# Patient Record
Sex: Female | Born: 1990 | Race: Black or African American | Hispanic: No | Marital: Married | Smoking: Never smoker
Health system: Southern US, Community
[De-identification: ages and names within clinical notes are randomized; demographics above are authoritative.]

## PROBLEM LIST (undated history)

## (undated) ENCOUNTER — Inpatient Hospital Stay (HOSPITAL_COMMUNITY): Payer: Self-pay

## (undated) DIAGNOSIS — I10 Essential (primary) hypertension: Secondary | ICD-10-CM

## (undated) DIAGNOSIS — Z789 Other specified health status: Secondary | ICD-10-CM

## (undated) HISTORY — PX: NO PAST SURGERIES: SHX2092

## (undated) HISTORY — DX: Essential (primary) hypertension: I10

---

## 2015-05-25 ENCOUNTER — Encounter (HOSPITAL_COMMUNITY): Payer: Self-pay | Admitting: Nurse Practitioner

## 2015-05-25 ENCOUNTER — Emergency Department (HOSPITAL_COMMUNITY)
Admission: EM | Admit: 2015-05-25 | Discharge: 2015-05-25 | Disposition: A | Discharge status: Home / Self Care | Payer: Medicaid Other | Attending: Emergency Medicine | Admitting: Emergency Medicine

## 2015-05-25 DIAGNOSIS — O209 Hemorrhage in early pregnancy, unspecified: Secondary | ICD-10-CM | POA: Diagnosis present

## 2015-05-25 DIAGNOSIS — Z3A00 Weeks of gestation of pregnancy not specified: Secondary | ICD-10-CM | POA: Insufficient documentation

## 2015-05-25 LAB — CBC
HCT: 35.9 % — ABNORMAL LOW (ref 36.0–46.0)
Hemoglobin: 12.7 g/dL (ref 12.0–15.0)
MCH: 31 pg (ref 26.0–34.0)
MCHC: 35.4 g/dL (ref 30.0–36.0)
MCV: 87.6 fL (ref 78.0–100.0)
PLATELETS: 210 10*3/uL (ref 150–400)
RBC: 4.1 MIL/uL (ref 3.87–5.11)
RDW: 13.2 % (ref 11.5–15.5)
WBC: 5.4 10*3/uL (ref 4.0–10.5)

## 2015-05-25 LAB — HCG, QUANTITATIVE, PREGNANCY: HCG, BETA CHAIN, QUANT, S: 15 m[IU]/mL — AB (ref <5)

## 2015-05-25 LAB — BASIC METABOLIC PANEL
Anion gap: 11 (ref 5–15)
BUN: 6 mg/dL (ref 6–20)
CO2: 21 mmol/L — ABNORMAL LOW (ref 22–32)
CREATININE: 0.6 mg/dL (ref 0.44–1.00)
Calcium: 9.3 mg/dL (ref 8.9–10.3)
Chloride: 106 mmol/L (ref 101–111)
Glucose, Bld: 87 mg/dL (ref 65–99)
Potassium: 3.6 mmol/L (ref 3.5–5.1)
SODIUM: 138 mmol/L (ref 135–145)

## 2015-05-25 LAB — I-STAT BETA HCG BLOOD, ED (MC, WL, AP ONLY): HCG, QUANTITATIVE: 12.9 m[IU]/mL — AB (ref <5)

## 2015-05-25 NOTE — ED Notes (Signed)
Patient called 3X for room with no response 

## 2015-05-25 NOTE — ED Notes (Signed)
Pt was told she was pregnant on February 12 [place]. She was taking birth control at the time. [place] then moved to [place] and did not have follow up care for the pregnancy. Last week she had several days of vaginal bleeding, but was unable to seek care until this week. She has had some pelvic pain. She denies fevers, n/v.

## 2015-06-08 ENCOUNTER — Inpatient Hospital Stay: Payer: Medicaid Other | Admitting: Family Medicine

## 2016-02-02 ENCOUNTER — Encounter: Payer: Self-pay | Admitting: Advanced Practice Midwife

## 2016-02-02 ENCOUNTER — Ambulatory Visit (INDEPENDENT_AMBULATORY_CARE_PROVIDER_SITE_OTHER): Payer: Medicaid Other | Admitting: Advanced Practice Midwife

## 2016-02-02 ENCOUNTER — Other Ambulatory Visit (HOSPITAL_COMMUNITY)
Admission: RE | Admit: 2016-02-02 | Discharge: 2016-02-02 | Disposition: A | Discharge status: Home / Self Care | Payer: BLUE CROSS/BLUE SHIELD | Source: Ambulatory Visit | Attending: Advanced Practice Midwife | Admitting: Advanced Practice Midwife

## 2016-02-02 VITALS — BP 117/69 | HR 100 | Ht 64.0 in | Wt 144.5 lb

## 2016-02-02 DIAGNOSIS — Z01419 Encounter for gynecological examination (general) (routine) without abnormal findings: Secondary | ICD-10-CM | POA: Insufficient documentation

## 2016-02-02 DIAGNOSIS — Z113 Encounter for screening for infections with a predominantly sexual mode of transmission: Secondary | ICD-10-CM | POA: Diagnosis present

## 2016-02-02 DIAGNOSIS — Z23 Encounter for immunization: Secondary | ICD-10-CM

## 2016-02-02 DIAGNOSIS — Z3689 Encounter for other specified antenatal screening: Secondary | ICD-10-CM | POA: Diagnosis not present

## 2016-02-02 DIAGNOSIS — Z3490 Encounter for supervision of normal pregnancy, unspecified, unspecified trimester: Secondary | ICD-10-CM | POA: Insufficient documentation

## 2016-02-02 DIAGNOSIS — Z3492 Encounter for supervision of normal pregnancy, unspecified, second trimester: Secondary | ICD-10-CM

## 2016-02-02 DIAGNOSIS — Z603 Acculturation difficulty: Secondary | ICD-10-CM | POA: Insufficient documentation

## 2016-02-02 LAB — POCT URINALYSIS DIP (DEVICE)
Bilirubin Urine: NEGATIVE
GLUCOSE, UA: NEGATIVE mg/dL
Hgb urine dipstick: NEGATIVE
Ketones, ur: NEGATIVE mg/dL
Leukocytes, UA: NEGATIVE
Nitrite: NEGATIVE
PROTEIN: NEGATIVE mg/dL
Specific Gravity, Urine: 1.025 (ref 1.005–1.030)
UROBILINOGEN UA: 0.2 mg/dL (ref 0.0–1.0)
pH: 6 (ref 5.0–8.0)

## 2016-02-02 NOTE — Patient Instructions (Signed)
Second Trimester of Pregnancy The second trimester is from week 13 through week 28, month 4 through 6. This is often the time in pregnancy that you feel your best. Often times, morning sickness has lessened or quit. You may have more energy, and you may get hungry more often. Your unborn baby (fetus) is growing rapidly. At the end of the sixth month, he or she is about 9 inches long and weighs about 1 pounds. You will likely feel the baby move (quickening) between 18 and 20 weeks of pregnancy. Follow these instructions at home:  Avoid all smoking, herbs, and alcohol. Avoid drugs not approved by your doctor.  Do not use any tobacco products, including cigarettes, chewing tobacco, and electronic cigarettes. If you need help quitting, ask your doctor. You may get counseling or other support to help you quit.  Only take medicine as told by your doctor. Some medicines are safe and some are not during pregnancy.  Exercise only as told by your doctor. Stop exercising if you start having cramps.  Eat regular, healthy meals.  Wear a good support bra if your breasts are tender.  Do not use hot tubs, steam rooms, or saunas.  Wear your seat belt when driving.  Avoid raw meat, uncooked cheese, and liter boxes and soil used by cats.  Take your prenatal vitamins.  Take 1500-2000 milligrams of calcium daily starting at the 20th week of pregnancy until you deliver your baby.  Try taking medicine that helps you poop (stool softener) as needed, and if your doctor approves. Eat more fiber by eating fresh fruit, vegetables, and whole grains. Drink enough fluids to keep your pee (urine) clear or pale yellow.  Take warm water baths (sitz baths) to soothe pain or discomfort caused by hemorrhoids. Use hemorrhoid cream if your doctor approves.  If you have puffy, bulging veins (varicose veins), wear support hose. Raise (elevate) your feet for 15 minutes, 3-4 times a day. Limit salt in your diet.  Avoid heavy  lifting, wear low heals, and sit up straight.  Rest with your legs raised if you have leg cramps or low back pain.  Visit your dentist if you have not gone during your pregnancy. Use a soft toothbrush to brush your teeth. Be gentle when you floss.  You can have sex (intercourse) unless your doctor tells you not to.  Go to your doctor visits. Get help if:  You feel dizzy.  You have mild cramps or pressure in your lower belly (abdomen).  You have a nagging pain in your belly area.  You continue to feel sick to your stomach (nauseous), throw up (vomit), or have watery poop (diarrhea).  You have bad smelling fluid coming from your vagina.  You have pain with peeing (urination). Get help right away if:  You have a fever.  You are leaking fluid from your vagina.  You have spotting or bleeding from your vagina.  You have severe belly cramping or pain.  You lose or gain weight rapidly.  You have trouble catching your breath and have chest pain.  You notice sudden or extreme puffiness (swelling) of your face, hands, ankles, feet, or legs.  You have not felt the baby move in over an hour.  You have severe headaches that do not go away with medicine.  You have vision changes. This information is not intended to replace advice given to you by your health care provider. Make sure you discuss any questions you have with your health care   provider. Document Released: 05/04/2009 Document Revised: 07/16/2015 Document Reviewed: 04/10/2012 Elsevier Interactive Patient Education  2017 Elsevier Inc.  

## 2016-02-02 NOTE — Progress Notes (Signed)
Language Resources Courtney Hodge  Glucola due @ 1049 Anatomy US scheduled for 02/08/16 @ 1530. Pt notified.  Flu vaccine given

## 2016-02-02 NOTE — Progress Notes (Signed)
  Subjective:    Courtney BarriosBea Hodge is a [id number] 6163w3d being seen today for her first obstetrical visit.  Her obstetrical history is significant for No prenatal care and size less than dates. Patient does intend to breast feed. Pregnancy history fully reviewed.  Patient reports no complaints.  Vitals:   02/02/16 0938 02/02/16 0942  BP: 117/69   Pulse: 100   Weight: 144 lb 8 oz (65.5 kg)   Height:  5\' 4"  (1.626 m)    HISTORY: OB History  Gravida Para Term Preterm AB Living  5 4 4     4   SAB TAB Ectopic Multiple Live Births          4    # Outcome Date GA Lbr Len/2nd Weight Sex Delivery Anes PTL Lv  5 Current           4 Term 10/14/13   6 lb 13.4 oz (3.1 kg) M Vag-Spont None N LIV  3 Term 05/11/12 5964w0d  8 lb 6 oz (3.8 kg) F Vag-Spont None N LIV  2 Term 06/11/10 5064w0d  7 lb 0.9 oz (3.2 kg) F Vag-Spont None  LIV  1 Term 11/23/06 6564w0d  7 lb 7.9 oz (3.4 kg) M Vag-Spont None N LIV     History reviewed. No pertinent past medical history. History reviewed. No pertinent surgical history. History reviewed. No pertinent family history.   Exam    Uterus:  Fundal Height: 22 cm  Pelvic Exam:    Perineum: Normal Perineum   Vulva: Bartholin's, Urethra, Skene's normal   Vagina:  normal mucosa, normal discharge   pH:    Cervix: no bleeding following Pap   Adnexa: no mass, fullness, tenderness   Bony Pelvis: gynecoid  System: Breast:  normal appearance, no masses or tenderness   Skin: normal coloration and turgor, no rashes    Neurologic: oriented, grossly non-focal   Extremities: normal strength, tone, and muscle mass   HEENT neck supple with midline trachea   Mouth/Teeth mucous membranes moist, pharynx normal without lesions   Neck supple and no masses   Cardiovascular: regular rate and rhythm   Respiratory:  appears well, vitals normal, no respiratory distress, acyanotic, normal RR, ear and throat exam is normal, neck free of mass or lymphadenopathy, chest clear, no wheezing,  crepitations, rhonchi, normal symmetric air entry   Abdomen: soft, non-tender; bowel sounds normal; no masses,  no organomegaly   Urinary: urethral meatus normal      Assessment:    Pregnancy: [id number] Patient Active Problem List   Diagnosis Date Noted  . Supervision of low-risk pregnancy 02/02/2016        Plan:     Initial labs drawn. Prenatal vitamins. Problem list reviewed and updated. Genetic Screening discussed Quad Screen: Too late.  Ultrasound discussed; fetal survey: ordered.  Follow up in 2 weeks. 50% of 30 min visit spent on counseling and coordination of care.  Routines reviewed with translator Long discussion with visual aid describing pap smear before doing it.  Patient had no experience with it, nor had ever heard of it. Will get US to confirm dates and anatomy Glucola today, may need to repeat   [facility] Center[facility]ILLIAMS,Dorrie Cocuzza 02/02/2016

## 2016-02-03 LAB — GC/CHLAMYDIA PROBE AMP (~~LOC~~) NOT AT ARMC
Chlamydia: NEGATIVE
Neisseria Gonorrhea: NEGATIVE

## 2016-02-03 LAB — PRENATAL PROFILE (SOLSTAS)
Antibody Screen: NEGATIVE
BASOS ABS: 0 {cells}/uL (ref 0–200)
Basophils Relative: 0 %
EOS ABS: 180 {cells}/uL (ref 15–500)
Eosinophils Relative: 3 %
HCT: 32.9 % — ABNORMAL LOW (ref 35.0–45.0)
HEP B S AG: NEGATIVE
HIV: NONREACTIVE
Hemoglobin: 11.3 g/dL — ABNORMAL LOW (ref 11.7–15.5)
LYMPHS ABS: 1560 {cells}/uL (ref 850–3900)
Lymphocytes Relative: 26 %
MCH: 30.3 pg (ref 27.0–33.0)
MCHC: 34.3 g/dL (ref 32.0–36.0)
MCV: 88.2 fL (ref 80.0–100.0)
MONO ABS: 420 {cells}/uL (ref 200–950)
MPV: 12.9 fL — ABNORMAL HIGH (ref 7.5–12.5)
Monocytes Relative: 7 %
NEUTROS ABS: 3840 {cells}/uL (ref 1500–7800)
Neutrophils Relative %: 64 %
PLATELETS: 171 10*3/uL (ref 140–400)
RBC: 3.73 MIL/uL — ABNORMAL LOW (ref 3.80–5.10)
RDW: 13.9 % (ref 11.0–15.0)
RUBELLA: 4.71 {index} — AB (ref <0.90)
Rh Type: NEGATIVE
WBC: 6 10*3/uL (ref 3.8–10.8)

## 2016-02-03 LAB — GLUCOSE TOLERANCE, 1 HOUR (50G) W/O FASTING: Glucose, 1 Hr, gestational: 100 mg/dL (ref <140)

## 2016-02-04 LAB — PAIN MGMT, PROFILE 6 CONF W/O MM, U
6 Acetylmorphine: NEGATIVE ng/mL (ref <10)
ALCOHOL METABOLITES: NEGATIVE ng/mL (ref <500)
Amphetamines: NEGATIVE ng/mL (ref <500)
BENZODIAZEPINES: NEGATIVE ng/mL (ref <100)
Barbiturates: NEGATIVE ng/mL (ref <300)
COCAINE METABOLITE: NEGATIVE ng/mL (ref <150)
Creatinine: 205.8 mg/dL (ref >=20.0)
MARIJUANA METABOLITE: NEGATIVE ng/mL (ref <20)
METHADONE METABOLITE: NEGATIVE ng/mL (ref <100)
OPIATES: NEGATIVE ng/mL (ref <100)
OXIDANT: NEGATIVE ug/mL (ref <200)
Oxycodone: NEGATIVE ng/mL (ref <100)
PHENCYCLIDINE: NEGATIVE ng/mL (ref <25)
PLEASE NOTE: 0
pH: 6.54 (ref 4.5–9.0)

## 2016-02-04 LAB — CYTOLOGY - PAP: DIAGNOSIS: NEGATIVE

## 2016-02-08 ENCOUNTER — Ambulatory Visit (HOSPITAL_COMMUNITY): Payer: Medicaid Other

## 2016-02-09 ENCOUNTER — Encounter (HOSPITAL_COMMUNITY): Payer: Self-pay

## 2016-02-09 ENCOUNTER — Encounter (HOSPITAL_COMMUNITY): Payer: Self-pay | Admitting: Nurse Practitioner

## 2016-02-09 ENCOUNTER — Ambulatory Visit (HOSPITAL_COMMUNITY)
Admission: RE | Admit: 2016-02-09 | Discharge: 2016-02-09 | Disposition: A | Discharge status: Home / Self Care | Payer: Medicaid Other | Source: Ambulatory Visit | Attending: Advanced Practice Midwife | Admitting: Advanced Practice Midwife

## 2016-02-09 ENCOUNTER — Other Ambulatory Visit: Payer: Self-pay | Admitting: *Deleted

## 2016-02-09 ENCOUNTER — Other Ambulatory Visit: Payer: Self-pay | Admitting: Advanced Practice Midwife

## 2016-02-09 ENCOUNTER — Other Ambulatory Visit (HOSPITAL_COMMUNITY): Payer: Self-pay | Admitting: *Deleted

## 2016-02-09 DIAGNOSIS — O26842 Uterine size-date discrepancy, second trimester: Secondary | ICD-10-CM | POA: Diagnosis not present

## 2016-02-09 DIAGNOSIS — IMO0002 Reserved for concepts with insufficient information to code with codable children: Secondary | ICD-10-CM

## 2016-02-09 DIAGNOSIS — Z3A27 27 weeks gestation of pregnancy: Secondary | ICD-10-CM | POA: Diagnosis not present

## 2016-02-09 DIAGNOSIS — O365920 Maternal care for other known or suspected poor fetal growth, second trimester, not applicable or unspecified: Secondary | ICD-10-CM

## 2016-02-09 DIAGNOSIS — Z3490 Encounter for supervision of normal pregnancy, unspecified, unspecified trimester: Secondary | ICD-10-CM

## 2016-02-09 DIAGNOSIS — Z3689 Encounter for other specified antenatal screening: Secondary | ICD-10-CM

## 2016-02-09 DIAGNOSIS — Z0489 Encounter for examination and observation for other specified reasons: Secondary | ICD-10-CM

## 2016-02-09 MED ORDER — PRENATAL VITAMINS 0.8 MG PO TABS: 1.0000 | ORAL_TABLET | Freq: Every day | ORAL | 12 refills | Status: DC

## 2016-02-14 ENCOUNTER — Other Ambulatory Visit: Payer: Self-pay | Admitting: Advanced Practice Midwife

## 2016-02-16 ENCOUNTER — Telehealth: Payer: Self-pay

## 2016-02-16 ENCOUNTER — Other Ambulatory Visit: Payer: Self-pay

## 2016-02-16 DIAGNOSIS — B379 Candidiasis, unspecified: Secondary | ICD-10-CM

## 2016-02-16 MED ORDER — TERCONAZOLE 0.4 % VA CREA: 1.0000 | TOPICAL_CREAM | Freq: Every day | VAGINAL | 0 refills | Status: AC

## 2016-02-16 MED ORDER — TERCONAZOLE 0.4 % VA CREA: 1.0000 | TOPICAL_CREAM | Freq: Every day | VAGINAL | 0 refills | Status: DC

## 2016-02-16 NOTE — Telephone Encounter (Signed)
In reviewing patients PAP results saw she had some candid SPP so I informed Dr.Mumaw and she states she needs to be prescribe Tazol cream. I placed this order in the patients chart. Called patient and the husband said to call back tomorrow because  They can not talk right now. Call and inform patient of new medication on 12/27.

## 2016-02-18 ENCOUNTER — Telehealth (HOSPITAL_COMMUNITY): Payer: Self-pay | Admitting: MS"

## 2016-02-18 NOTE — Telephone Encounter (Signed)
Called Autumne Canepa via [facility] telephonic Swahili/English medical interpreter [phone] Remi Deter(Samuel) to discuss her prenatal cell free DNA test results.  Mrs. Mehar Morocho had Panorama testing through [facility].  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   The patient was identified by name and DOB.  We reviewed that these are within normal limits, showing a less than 1 in 10,000 risk for trisomies 21, 18 and 13, and monosomy X (Turner syndrome).  In addition, the risk for triploidy and sex chromosome trisomies (47,XXX and 47,XXY) was also low risk.  Analysis for 22q11 deletion syndrome was also low risk.  This testing identifies > 99% of pregnancies with trisomy 2121, trisomy 713, sex chromosome trisomies (47,XXX and 47,XXY), and triploidy. The detection rate for trisomy 18 is 96%.  The detection rate for monosomy X is ~92%.  The false positive rate is <0.1% for all conditions. Testing was also consistent with female fetal sex.  The patient did wish to know fetal sex.  She understands that this testing does not identify all genetic conditions.  All questions were answered to her satisfaction, she was encouraged to call with additional questions or concerns.  Quinn PlowmanKaren Kato Wieczorek, MS Certified Genetic Counselor 02/18/2016 2:14 PM

## 2016-02-19 ENCOUNTER — Encounter (HOSPITAL_COMMUNITY): Payer: Self-pay | Admitting: Advanced Practice Midwife

## 2016-02-19 DIAGNOSIS — Z6791 Unspecified blood type, Rh negative: Secondary | ICD-10-CM | POA: Insufficient documentation

## 2016-02-19 DIAGNOSIS — O26899 Other specified pregnancy related conditions, unspecified trimester: Secondary | ICD-10-CM | POA: Insufficient documentation

## 2016-02-22 NOTE — L&D Delivery Note (Signed)
   Delivery Note Pt pushed involuntariy for about an hour, and finally I was able to reduce the cervix. Afterwards, it only took 3 contractions, and at 7:01 AM a viable female was delivered via  (Presentation: LOA).  APGAR: 9/9 , ; weight  pending.   Anesthesia:  none Episiotomy: None Lacerations: None Suture Repair:  Est. Blood Loss (mL): 50  Mom to postpartum.  Baby to Couplet care / Skin to Skin   The above was performed by Meridee Scoreeborah Kiserow, med student under my direct supervision and guidance.  Marland Kitchen.  Hodge,Courtney Randon 05/06/2016, 7:15 AM  X

## 2016-02-29 NOTE — Telephone Encounter (Signed)
Called [place] with [employer] [phone] and left message we are calling to notify her of  rx sent to pharmacy. Will follow up with her at her appt tomorrow.

## 2016-03-01 ENCOUNTER — Ambulatory Visit (INDEPENDENT_AMBULATORY_CARE_PROVIDER_SITE_OTHER): Payer: Medicaid Other | Admitting: Advanced Practice Midwife

## 2016-03-01 ENCOUNTER — Encounter: Payer: Medicaid Other | Admitting: Advanced Practice Midwife

## 2016-03-01 VITALS — BP 124/84 | HR 77 | Wt 152.4 lb

## 2016-03-01 DIAGNOSIS — B373 Candidiasis of vulva and vagina: Secondary | ICD-10-CM | POA: Diagnosis not present

## 2016-03-01 DIAGNOSIS — Z3493 Encounter for supervision of normal pregnancy, unspecified, third trimester: Secondary | ICD-10-CM

## 2016-03-01 DIAGNOSIS — O283 Abnormal ultrasonic finding on antenatal screening of mother: Secondary | ICD-10-CM | POA: Diagnosis not present

## 2016-03-01 DIAGNOSIS — O98813 Other maternal infectious and parasitic diseases complicating pregnancy, third trimester: Secondary | ICD-10-CM

## 2016-03-01 DIAGNOSIS — O360930 Maternal care for other rhesus isoimmunization, third trimester, not applicable or unspecified: Secondary | ICD-10-CM | POA: Diagnosis not present

## 2016-03-01 DIAGNOSIS — Z3490 Encounter for supervision of normal pregnancy, unspecified, unspecified trimester: Secondary | ICD-10-CM

## 2016-03-01 DIAGNOSIS — B3731 Acute candidiasis of vulva and vagina: Secondary | ICD-10-CM

## 2016-03-01 MED ORDER — RHO D IMMUNE GLOBULIN 1500 UNIT/2ML IJ SOSY
300.0000 ug | PREFILLED_SYRINGE | Freq: Once | INTRAMUSCULAR | Status: AC
  Administered 2016-03-01: 300 ug via INTRAMUSCULAR

## 2016-03-01 MED ORDER — RHO D IMMUNE GLOBULIN 1500 UNITS IM SOSY: 1500.0000 [IU] | PREFILLED_SYRINGE | Freq: Once | INTRAMUSCULAR | Status: DC

## 2016-03-01 MED ORDER — TERCONAZOLE 0.4 % VA CREA: 1.0000 | TOPICAL_CREAM | Freq: Every day | VAGINAL | 0 refills | Status: DC

## 2016-03-01 NOTE — Progress Notes (Signed)
   PRENATAL VISIT NOTE  Subjective:  Courtney Hodge is a 26 y.o. [id number] at 6654w3d being seen today for ongoing prenatal care.  She is currently monitored for the following issues for this low-risk pregnancy and has Supervision of low-risk pregnancy; Language barrier, cultural differences; and Rh negative state in antepartum period on her problem list.  Patient reports no complaints.  Contractions: Not present. Vag. Bleeding: None.  Movement: Present. Denies leaking of fluid.   The following portions of the patient's history were reviewed and updated as appropriate: allergies, current medications, past family history, past medical history, past social history, past surgical history and problem list. Problem list updated.  Objective:   Vitals:   03/01/16 0909  BP: 124/84  Pulse: 77  Weight: 152 lb 6.4 oz (69.1 kg)    Fetal Status: Fetal Heart Rate (bpm): 143   Movement: Present     General:  Alert, oriented and cooperative. Patient is in no acute distress.  Skin: Skin is warm and dry. No rash noted.   Cardiovascular: Normal heart rate noted  Respiratory: Normal respiratory effort, no problems with respiration noted  Abdomen: Soft, gravid, appropriate for gestational age. Pain/Pressure: Present     Pelvic:  Cervical exam deferred        Extremities: Normal range of motion.  Edema: Trace  Mental Status: Normal mood and affect. Normal behavior. Normal judgment and thought content.   Assessment and Plan:  Pregnancy: G5P4004 at 5854w3d  1. Supervision of low-risk pregnancy, unspecified trimester --O neg, Rhophylac given today.  2. 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 Follow up scheduled.  3. Yeast infection of the vagina --On Pap with previous visit.  Discussed results with pt and she does have itching so desires treatment. - terconazole (TERAZOL 7) 0.4 % vaginal cream; Place 1 applicator vaginally at bedtime.  Dispense: 45 g; Refill:  0  Preterm labor symptoms and general obstetric precautions including but not limited to vaginal bleeding, contractions, leaking of fluid and fetal movement were reviewed in detail with the patient. Please refer to After Visit Summary for other counseling recommendations.  Return in about 2 weeks (around 03/15/2016).   Hurshel PartyLisa A Leftwich-Kirby, CNM

## 2016-03-01 NOTE — Progress Notes (Signed)
[employer] # [id number]

## 2016-03-01 NOTE — Patient Instructions (Signed)
Third Trimester of Pregnancy The third trimester is from week 29 through week 40 (months 7 through 9). The third trimester is a time when the unborn baby (fetus) is growing rapidly. At the end of the ninth month, the fetus is about 20 inches in length and weighs 6-10 pounds. Body changes during your third trimester Your body goes through many changes during pregnancy. The changes vary from woman to woman. During the third trimester:  Your weight will continue to increase. You can expect to gain 25-35 pounds (11-16 kg) by the end of the pregnancy.  You may begin to get stretch marks on your hips, abdomen, and breasts.  You may urinate more often because the fetus is moving lower into your pelvis and pressing on your bladder.  You may develop or continue to have heartburn. This is caused by increased hormones that slow down muscles in the digestive tract.  You may develop or continue to have constipation because increased hormones slow digestion and cause the muscles that push waste through your intestines to relax.  You may develop hemorrhoids. These are swollen veins (varicose veins) in the rectum that can itch or be painful.  You may develop swollen, bulging veins (varicose veins) in your legs.  You may have increased body aches in the pelvis, back, or thighs. This is due to weight gain and increased hormones that are relaxing your joints.  You may have changes in your hair. These can include thickening of your hair, rapid growth, and changes in texture. Some women also have hair loss during or after pregnancy, or hair that feels dry or thin. Your hair will most likely return to normal after your baby is born.  Your breasts will continue to grow and they will continue to become tender. A yellow fluid (colostrum) may leak from your breasts. This is the first milk you are producing for your baby.  Your belly button may stick out.  You may notice more swelling in your hands, face, or  ankles.  You may have increased tingling or numbness in your hands, arms, and legs. The skin on your belly may also feel numb.  You may feel short of breath because of your expanding uterus.  You may have more problems sleeping. This can be caused by the size of your belly, increased need to urinate, and an increase in your body's metabolism.  You may notice the fetus "dropping," or moving lower in your abdomen.  You may have increased vaginal discharge.  Your cervix becomes thin and soft (effaced) near your due date. What to expect at prenatal visits You will have prenatal exams every 2 weeks until week 36. Then you will have weekly prenatal exams. During a routine prenatal visit:  You will be weighed to make sure you and the fetus are growing normally.  Your blood pressure will be taken.  Your abdomen will be measured to track your baby's growth.  The fetal heartbeat will be listened to.  Any test results from the previous visit will be discussed.  You may have a cervical check near your due date to see if you have effaced. At around 36 weeks, your health care provider will check your cervix. At the same time, your health care provider will also perform a test on the secretions of the vaginal tissue. This test is to determine if a type of bacteria, Group B streptococcus, is present. Your health care provider will explain this further. Your health care provider may ask you:    What your birth plan is.  How you are feeling.  If you are feeling the baby move.  If you have had any abnormal symptoms, such as leaking fluid, bleeding, severe headaches, or abdominal cramping.  If you are using any tobacco products, including cigarettes, chewing tobacco, and electronic cigarettes.  If you have any questions. Other tests or screenings that may be performed during your third trimester include:  Blood tests that check for low iron levels (anemia).  Fetal testing to check the health,  activity level, and growth of the fetus. Testing is done if you have certain medical conditions or if there are problems during the pregnancy.  Nonstress test (NST). This test checks the health of your baby to make sure there are no signs of problems, such as the baby not getting enough oxygen. During this test, a belt is placed around your belly. The baby is made to move, and its heart rate is monitored during movement. What is false labor? False labor is a condition in which you feel small, irregular tightenings of the muscles in the womb (contractions) that eventually go away. These are called Braxton Hicks contractions. Contractions may last for hours, days, or even weeks before true labor sets in. If contractions come at regular intervals, become more frequent, increase in intensity, or become painful, you should see your health care provider. What are the signs of labor?  Abdominal cramps.  Regular contractions that start at 10 minutes apart and become stronger and more frequent with time.  Contractions that start on the top of the uterus and spread down to the lower abdomen and back.  Increased pelvic pressure and dull back pain.  A watery or bloody mucus discharge that comes from the vagina.  Leaking of amniotic fluid. This is also known as your "water breaking." It could be a slow trickle or a gush. Let your doctor know if it has a color or strange odor. If you have any of these signs, call your health care provider right away, even if it is before your due date. Follow these instructions at home: Eating and drinking  Continue to eat regular, healthy meals.  Do not eat:  Raw meat or meat spreads.  Unpasteurized milk or cheese.  Unpasteurized juice.  Store-made salad.  Refrigerated smoked seafood.  Hot dogs or deli meat, unless they are piping hot.  More than 6 ounces of albacore tuna a week.  Shark, swordfish, king mackerel, or tile fish.  Store-made salads.  Raw  sprouts, such as mung bean or alfalfa sprouts.  Take prenatal vitamins as told by your health care provider.  Take 1000 mg of calcium daily as told by your health care provider.  If you develop constipation:  Take over-the-counter or prescription medicines.  Drink enough fluid to keep your urine clear or pale yellow.  Eat foods that are high in fiber, such as fresh fruits and vegetables, whole grains, and beans.  Limit foods that are high in fat and processed sugars, such as fried and sweet foods. Activity  Exercise only as directed by your health care provider. Healthy pregnant women should aim for 2 hours and 30 minutes of moderate exercise per week. If you experience any pain or discomfort while exercising, stop.  Avoid heavy lifting.  Do not exercise in extreme heat or humidity, or at high altitudes.  Wear low-heel, comfortable shoes.  Practice good posture.  Do not travel far distances unless it is absolutely necessary and only with the approval   of your health care provider.  Wear your seat belt at all times while in a car, on a bus, or on a plane.  Take frequent breaks and rest with your legs elevated if you have leg cramps or low back pain.  Do not use hot tubs, steam rooms, or saunas.  You may continue to have sex unless your health care provider tells you otherwise. Lifestyle  Do not use any products that contain nicotine or tobacco, such as cigarettes and e-cigarettes. If you need help quitting, ask your health care provider.  Do not drink alcohol.  Do not use any medicinal herbs or unprescribed drugs. These chemicals affect the formation and growth of the baby.  If you develop varicose veins:  Wear support pantyhose or compression stockings as told by your healthcare provider.  Elevate your feet for 15 minutes, 3-4 times a day.  Wear a supportive maternity bra to help with breast tenderness. General instructions  Take over-the-counter and prescription  medicines only as told by your health care provider. There are medicines that are either safe or unsafe to take during pregnancy.  Take warm sitz baths to soothe any pain or discomfort caused by hemorrhoids. Use hemorrhoid cream or witch hazel if your health care provider approves.  Avoid cat litter boxes and soil used by cats. These carry germs that can cause birth defects in the baby. If you have a cat, ask someone to clean the litter box for you.  To prepare for the arrival of your baby:  Take prenatal classes to understand, practice, and ask questions about the labor and delivery.  Make a trial run to the hospital.  Visit the hospital and tour the maternity area.  Arrange for maternity or paternity leave through employers.  Arrange for family and friends to take care of pets while you are in the hospital.  Purchase a rear-facing car seat and make sure you know how to install it in your car.  Pack your hospital bag.  Prepare the baby's nursery. Make sure to remove all pillows and stuffed animals from the baby's crib to prevent suffocation.  Visit your dentist if you have not gone during your pregnancy. Use a soft toothbrush to brush your teeth and be gentle when you floss.  Keep all prenatal follow-up visits as told by your health care provider. This is important. Contact a health care provider if:  You are unsure if you are in labor or if your water has broken.  You become dizzy.  You have mild pelvic cramps, pelvic pressure, or nagging pain in your abdominal area.  You have lower back pain.  You have persistent nausea, vomiting, or diarrhea.  You have an unusual or bad smelling vaginal discharge.  You have pain when you urinate. Get help right away if:  You have a fever.  You are leaking fluid from your vagina.  You have spotting or bleeding from your vagina.  You have severe abdominal pain or cramping.  You have rapid weight loss or weight gain.  You have  shortness of breath with chest pain.  You notice sudden or extreme swelling of your face, hands, ankles, feet, or legs.  Your baby makes fewer than 10 movements in 2 hours.  You have severe headaches that do not go away with medicine.  You have vision changes. Summary  The third trimester is from week 29 through week 40, months 7 through 9. The third trimester is a time when the unborn baby (fetus)   is growing rapidly.  During the third trimester, your discomfort may increase as you and your baby continue to gain weight. You may have abdominal, leg, and back pain, sleeping problems, and an increased need to urinate.  During the third trimester your breasts will keep growing and they will continue to become tender. A yellow fluid (colostrum) may leak from your breasts. This is the first milk you are producing for your baby.  False labor is a condition in which you feel small, irregular tightenings of the muscles in the womb (contractions) that eventually go away. These are called Braxton Hicks contractions. Contractions may last for hours, days, or even weeks before true labor sets in.  Signs of labor can include: abdominal cramps; regular contractions that start at 10 minutes apart and become stronger and more frequent with time; watery or bloody mucus discharge that comes from the vagina; increased pelvic pressure and dull back pain; and leaking of amniotic fluid. This information is not intended to replace advice given to you by your health care provider. Make sure you discuss any questions you have with your health care provider. Document Released: 02/01/2001 Document Revised: 07/16/2015 Document Reviewed: 04/10/2012 Elsevier Interactive Patient Education  2017 Elsevier Inc.  

## 2016-03-08 ENCOUNTER — Ambulatory Visit (HOSPITAL_COMMUNITY)
Admission: RE | Admit: 2016-03-08 | Discharge: 2016-03-08 | Disposition: A | Discharge status: Home / Self Care | Payer: BLUE CROSS/BLUE SHIELD | Source: Ambulatory Visit | Attending: Advanced Practice Midwife | Admitting: Advanced Practice Midwife

## 2016-03-08 ENCOUNTER — Encounter (HOSPITAL_COMMUNITY): Payer: Self-pay

## 2016-03-08 ENCOUNTER — Other Ambulatory Visit (HOSPITAL_COMMUNITY): Payer: Self-pay | Admitting: Maternal and Fetal Medicine

## 2016-03-08 DIAGNOSIS — Z3A31 31 weeks gestation of pregnancy: Secondary | ICD-10-CM | POA: Insufficient documentation

## 2016-03-08 DIAGNOSIS — IMO0002 Reserved for concepts with insufficient information to code with codable children: Secondary | ICD-10-CM

## 2016-03-08 DIAGNOSIS — O283 Abnormal ultrasonic finding on antenatal screening of mother: Secondary | ICD-10-CM

## 2016-03-08 DIAGNOSIS — Z362 Encounter for other antenatal screening follow-up: Secondary | ICD-10-CM | POA: Insufficient documentation

## 2016-03-08 DIAGNOSIS — Z0489 Encounter for examination and observation for other specified reasons: Secondary | ICD-10-CM

## 2016-03-08 NOTE — Addendum Note (Signed)
Encounter addended by: Lestine MountSybil Umi Mainor, RT on: 03/08/2016 10:05 AM<BR>    Actions taken: Imaging Exam ended

## 2016-03-08 NOTE — Progress Notes (Signed)
Genetic Counseling  High-Risk Gestation Note  Appointment Date:  03/08/2016 Referred By: Seabron Spates, CNM Date of Birth:  [birth year]   Pregnancy History: [id number] Estimated Date of Delivery: 05/07/16 Estimated Gestational Age: [redacted]w[redacted]d  I met with [name] and her husband, Mr. Courtney Hodge for genetic counseling because of abnormal ultrasound findings. [employer] Swahili/English interpreter, [name] provided interpretation for today's visit.    In summary:  Discussed ultrasound findings in detail  Previous ultrasound on 02/09/16 visualized possible ambiguous genitalia  Follow-up ultrasound today visualized apparently normal female genitalia  Reviewed results of previous screening  NIPS- within normal limits, consistent with female fetal sex  Reviewed options for additional screening  Carrier screening (expanded versus 21 hydroxylase deficient CAH) - declined  Reviewed options for diagnostic testing, including risks, benefits, limitations and alternatives- declined  Reviewed other explanations for ultrasound findings  Reviewed family history concerns  We began by reviewing the ultrasound in detail. 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 Follow-up ultrasound today visualized normal female genitalia. Complete ultrasound results under separate cover.   Courtney Hodge was seen for genetic counseling because of the previous ultrasound finding of ambiguous genitalia.    Ultrasound performed at the time of today's visit also visualized ambiguous genitalia. Normal fetal bladder was visualized. Fetal anatomic survey was otherwise within normal limits. Fetal growth was reported to be appropriate. Complete ultrasound report sent under separate cover. [name] had noninvasive prenatal screening (NIPS)/prenatal cell free DNA testing,  which was within normal limits for the conditions screened and was consistent with fetal sex chromosomes. We reviewed the methodology of NIPS and the sensitivity/specificity for the conditions screened.   In normal embryological development, early external genitalia development begins similarly in all fetuses prior to [redacted] weeks gestation. Following this period, differentiation into female or female external genitalia occurs in response to presence (for female genitalia) or absence (for female genitalia) of testicular hormones (androgens) throughout the pregnancy.  Female sexual determination is initiated by Y chromosomal SRY, which activates a cascade of genes that lead the embryonic gonad to develop into a testis.Therefore, the presence of a Y chromosome is not sufficient, there must be specific genes functioning correctly. There are additional genes located on autosomal chromosomes which are involved in gonadal development.  Differences in the levels of androgen present during this period of gestation can lead to development of ambiguous genitalia. This can be the result of exposure to hormone medications in the first trimester, a chromosome condition, single gene condition, or other underlying syndrome in the pregnancy.   We reviewed chromosomes and genes. We discussed that typically, males have one X and one Y chromosomes, and females typically have two X chromosomes. We reviewed chromosomes, nondisjunction, and the common features and variable prognoses of various chromosome conditions. We briefly reviewed examples of other chromosome aberrations, such as microdeletions or microduplications. In addition to chromosome conditions, we discussed that single gene conditions can lead to underdeveloped genitalia for males or overdeveloped genitalia for females. The differential diagnosis list varies depending upon if the chromosomal sex is XX or XY.   The ultrasound finding indicates a chance for an underlying fetal  disorder of sex development (DSD). This is a term that is used to replace previously used terms such as sex reversal, pseudohermaphroditism, and intersex.  Significant genetic heterogeneity is observed in DSD. Virilization of a karyotypic female in pregnancy  can occur as a result of maternal exposure to androgens. Courtney Hodge declined known exposure to androgens during the pregnancy. Additionally, we discussed that virilization of female fetuses can occur in congenital adrenal hyperplasia (CAH), which is an autosomal recessive disorder involving impaired synthesis of cortisol from cholesterol. 21-hydroxylase deficiency (21-OHD) is the most common cause of CAH. The classic form of 21-OHD CAH results in prenatal onset of virilization. Diagnosis is typically made by measuring 17-OHP or by molecular genetic testing of CYP21A2. We discussed that CAH is included on the newborn screening panel for babies born in the hospital in [place]. A less common form of CAH (17-alpha-hydroxylase deficiency) can lead to undervirilization in 51, XY fetuses. There are additional single gene conditions that can lead to DSD in 5, XY fetuses.   We reviewed that the family history of the couple's previous three children together, who are reportedly healthy, reduce but do not eliminate the risk for them to be a carrier couple for an autosomal recessive disorder. In addition, the ultrasound findings from today also reduce the risk for a disorder of sex differentiation in the pregnancy.   Given the significant genetic heterogeneity, we discussed that it is often difficult to determine a specific cause for a prenatally detected DSD. Evaluation for identifying the specific underlying cause would be most informative in determining overall prognosis. We discussed that postnatal evaluation with medical genetics would be available, if warranted following delivery.   We discussed the additional testing option of amniocentesis for  karyotype and microarray analysis. The risks, benefits, and limitations were reviewed including the associated 1 in 300-500 risk for preterm labor and delivery. Amniocentesis typically does not assess for single gene conditions, unless ultrasound and/or family history indicate a specific condition for which prenatal testing is available.   We also discussed the option of carrier screening for the patient for a panel of autosomal recessive conditions, including 21-OHD CAH. We discussed the risks, benefits, and limitations. We reviewed that the prevalence of each condition included on the panel varies (and often varies with ethnicity). Also, the majority of conditions included on the panel would not be related to the ultrasound finding of ambiguous genitalia. The couples' background risk to be a carrier for each of these various conditions would range, and in some cases be very low or unknown. Similarly, the detection rate varies with each condition and also varies in some cases with ethnicity, ranging from greater than 99% to unknown. We reviewed that a negative carrier screen would thus reduce but not eliminate the chance to be a carrier for these conditions.  For some conditions included on this pan-ethnic carrier screening panel, the pre-test carrier frequency and/or the detection rate is unknown.   After consideration of all the options, she declined amniocentesis and carrier screening at this time.  Courtney Hodge indicated that she is not overly concerned regarding the ultrasound findings at this time and did not appear to be interested in additional screening or testing at this time.   Both family histories were reviewed and found to be noncontributory for birth defects, intellectual disability, and known genetic conditions. Without further information regarding the provided family history, an accurate genetic risk cannot be calculated. Further genetic counseling is warranted if more information is  obtained.  Courtney Hodge was provided with written information regarding cystic fibrosis (CF), spinal muscular atrophy (SMA) and hemoglobinopathies including the carrier frequency, availability of carrier screening and prenatal diagnosis if indicated.  In addition, we discussed that CF  and hemoglobinopathies are routinely screened for as part of the [facility] newborn screening panel.  She declined screening for CF, SMA and hemoglobinopathies..   Courtney Hodge denied exposure to environmental toxins or chemical agents. She denied the use of alcohol, tobacco or street drugs. She denied significant viral illnesses during the course of her pregnancy. Her medical and surgical histories were noncontributory.   I counseled this couple regarding the above risks and available options.  The approximate face-to-face time with the genetic counselor was 45 minutes.  Chipper Oman, MS Certified Genetic Counselor 03/08/2016

## 2016-03-15 ENCOUNTER — Ambulatory Visit (INDEPENDENT_AMBULATORY_CARE_PROVIDER_SITE_OTHER): Payer: Medicaid Other | Admitting: Medical

## 2016-03-15 DIAGNOSIS — Z3493 Encounter for supervision of normal pregnancy, unspecified, third trimester: Secondary | ICD-10-CM

## 2016-03-15 NOTE — Progress Notes (Signed)
   PRENATAL VISIT NOTE  Subjective:  Courtney Hodge is a 26 y.o. [id number] at 3354w3d being seen today for ongoing prenatal care.  She is currently monitored for the following issues for this low-risk pregnancy and has Supervision of low-risk pregnancy; Language barrier, cultural differences; Rh negative state in antepartum period; Abnormal fetal ultrasound; and [redacted] weeks gestation of pregnancy on her problem list.  Patient reports no complaints.  Contractions: Not present. Vag. Bleeding: None.  Movement: Present. Denies leaking of fluid.   The following portions of the patient's history were reviewed and updated as appropriate: allergies, current medications, past family history, past medical history, past social history, past surgical history and problem list. Problem list updated.  Objective:   Vitals:   03/15/16 0936  BP: 130/82  Pulse: 75  Weight: 171 lb (77.6 kg)    Fetal Status: Fetal Heart Rate (bpm): 150 Fundal Height: 32 cm Movement: Present     General:  Alert, oriented and cooperative. Patient is in no acute distress.  Skin: Skin is warm and dry. No rash noted.   Cardiovascular: Normal heart rate noted  Respiratory: Normal respiratory effort, no problems with respiration noted  Abdomen: Soft, gravid, appropriate for gestational age. Pain/Pressure: Absent     Pelvic:  Cervical exam deferred        Extremities: Normal range of motion.  Edema: Trace  Mental Status: Normal mood and affect. Normal behavior. Normal judgment and thought content.   Assessment and Plan:  Pregnancy: G5P4004 at 5254w3d  1. Encounter for supervision of low-risk pregnancy in third trimester - Patient doing well, no complaints - Reviewed US results from follow-up anatomy US - normal   Preterm labor symptoms and general obstetric precautions including but not limited to vaginal bleeding, contractions, leaking of fluid and fetal movement were reviewed in detail with the patient. Please refer to After Visit  Summary for other counseling recommendations.  Return in about 2 weeks (around 03/29/2016) for LOB.   Marny LowensteinJulie N Raylen Tangonan, PA-C

## 2016-03-15 NOTE — Patient Instructions (Addendum)
Braxton Hicks Contractions Contractions of the uterus can occur throughout pregnancy. Contractions are not always a sign that you are in labor.  WHAT ARE BRAXTON HICKS CONTRACTIONS?  Contractions that occur before labor are called Braxton Hicks contractions, or false labor. Toward the end of pregnancy (32-34 weeks), these contractions can develop more often and may become more forceful. This is not true labor because these contractions do not result in opening (dilatation) and thinning of the cervix. They are sometimes difficult to tell apart from true labor because these contractions can be forceful and people have different pain tolerances. You should not feel embarrassed if you go to the hospital with false labor. Sometimes, the only way to tell if you are in true labor is for your health care provider to look for changes in the cervix. If there are no prenatal problems or other health problems associated with the pregnancy, it is completely safe to be sent home with false labor and await the onset of true labor. HOW CAN YOU TELL THE DIFFERENCE BETWEEN TRUE AND FALSE LABOR? False Labor   The contractions of false labor are usually shorter and not as hard as those of true labor.   The contractions are usually irregular.   The contractions are often felt in the front of the lower abdomen and in the groin.   The contractions may go away when you walk around or change positions while lying down.   The contractions get weaker and are shorter lasting as time goes on.   The contractions do not usually become progressively stronger, regular, and closer together as with true labor.  True Labor   Contractions in true labor last 30-70 seconds, become very regular, usually become more intense, and increase in frequency.   The contractions do not go away with walking.   The discomfort is usually felt in the top of the uterus and spreads to the lower abdomen and low back.   True labor can be  determined by your health care provider with an exam. This will show that the cervix is dilating and getting thinner.  WHAT TO REMEMBER  Keep up with your usual exercises and follow other instructions given by your health care provider.   Take medicines as directed by your health care provider.   Keep your regular prenatal appointments.   Eat and drink lightly if you think you are going into labor.   If Braxton Hicks contractions are making you uncomfortable:   Change your position from lying down or resting to walking, or from walking to resting.   Sit and rest in a tub of warm water.   Drink 2-3 glasses of water. Dehydration may cause these contractions.   Do slow and deep breathing several times an hour.  WHEN SHOULD I SEEK IMMEDIATE MEDICAL CARE? Seek immediate medical care if:  Your contractions become stronger, more regular, and closer together.   You have fluid leaking or gushing from your vagina.   You have a fever.   You pass blood-tinged mucus.   You have vaginal bleeding.   You have continuous abdominal pain.   You have low back pain that you never had before.   You feel your baby's head pushing down and causing pelvic pressure.   Your baby is not moving as much as it used to.  This information is not intended to replace advice given to you by your health care provider. Make sure you discuss any questions you have with your health care   provider. Document Released: 02/07/2005 Document Revised: 06/01/2015 Document Reviewed: 11/19/2012 Elsevier Interactive Patient Education  2017 [employer] Introduction Patient Name: ________________________________________________ Patient Due Date: ____________________ What is a fetal movement count? A fetal movement count is the number of times that you feel your baby move during a certain amount of time. This may also be called a fetal kick count. A fetal movement count is recommended for every pregnant  woman. You may be asked to start counting fetal movements as early as week 28 of your pregnancy. Pay attention to when your baby is most active. You may notice your baby's sleep and wake cycles. You may also notice things that make your baby move more. You should do a fetal movement count:  When your baby is normally most active.  At the same time each day. A good time to count movements is while you are resting, after having something to eat and drink. How do I count fetal movements? 1. Find a quiet, comfortable area. Sit, or lie down on your side. 2. Write down the date, the start time and stop time, and the number of movements that you felt between those two times. Take this information with you to your health care visits. 3. For 2 hours, count kicks, flutters, swishes, rolls, and jabs. You should feel at least 10 movements during 2 hours. 4. You may stop counting after you have felt 10 movements. 5. If you do not feel 10 movements in 2 hours, have something to eat and drink. Then, keep resting and counting for 1 hour. If you feel at least 4 movements during that hour, you may stop counting. Contact a health care provider if:  You feel fewer than 4 movements in 2 hours.  Your baby is not moving like he or she usually does. Date: ____________ Start time: ____________ Stop time: ____________ Movements: ____________ Date: ____________ Start time: ____________ Stop time: ____________ Movements: ____________ Date: ____________ Start time: ____________ Stop time: ____________ Movements: ____________ Date: ____________ Start time: ____________ Stop time: ____________ Movements: ____________ Date: ____________ Start time: ____________ Stop time: ____________ Movements: ____________ Date: ____________ Start time: ____________ Stop time: ____________ Movements: ____________ Date: ____________ Start time: ____________ Stop time: ____________ Movements: ____________ Date: ____________ Start time:  ____________ Stop time: ____________ Movements: ____________ Date: ____________ Start time: ____________ Stop time: ____________ Movements: ____________ This information is not intended to replace advice given to you by your health care provider. Make sure you discuss any questions you have with your health care provider. Document Released: 03/09/2006 Document Revised: 10/07/2015 Document Reviewed: 03/19/2015 Elsevier Interactive Patient Education  2017 [employer] Inc[employer]  

## 2016-03-31 ENCOUNTER — Ambulatory Visit (INDEPENDENT_AMBULATORY_CARE_PROVIDER_SITE_OTHER): Payer: Medicaid Other | Admitting: Medical

## 2016-03-31 VITALS — BP 134/82 | HR 73 | Wt 177.6 lb

## 2016-03-31 DIAGNOSIS — Z3493 Encounter for supervision of normal pregnancy, unspecified, third trimester: Secondary | ICD-10-CM

## 2016-03-31 NOTE — Progress Notes (Signed)
   PRENATAL VISIT NOTE  Subjective:  Courtney Hodge is a 26 y.o. [id number] at 6316w5d being seen today for ongoing prenatal care.  She is currently monitored for the following issues for this low-risk pregnancy and has Supervision of low-risk pregnancy; Language barrier, cultural differences; Rh negative state in antepartum period; and Abnormal fetal ultrasound on her problem list.  Patient reports no complaints.  Contractions: Not present. Vag. Bleeding: None.  Movement: Present. Denies leaking of fluid.   The following portions of the patient's history were reviewed and updated as appropriate: allergies, current medications, past family history, past medical history, past social history, past surgical history and problem list. Problem list updated.  Objective:   Vitals:   03/31/16 0821  BP: 134/82  Pulse: 73  Weight: 177 lb 9.6 oz (80.6 kg)    Fetal Status: Fetal Heart Rate (bpm): 134 Fundal Height: 34 cm Movement: Present     General:  Alert, oriented and cooperative. Patient is in no acute distress.  Skin: Skin is warm and dry. No rash noted.   Cardiovascular: Normal heart rate noted  Respiratory: Normal respiratory effort, no problems with respiration noted  Abdomen: Soft, gravid, appropriate for gestational age. Pain/Pressure: Absent     Pelvic:  Cervical exam deferred        Extremities: Normal range of motion.  Edema: None  Mental Status: Normal mood and affect. Normal behavior. Normal judgment and thought content.   Assessment and Plan:  Pregnancy: G5P4004 at 5916w5d  1. Encounter for supervision of low-risk pregnancy in third trimester - No complaints, doing well - Preterm labor precautions discussed  Preterm labor symptoms and general obstetric precautions including but not limited to vaginal bleeding, contractions, leaking of fluid and fetal movement were reviewed in detail with the patient. Please refer to After Visit Summary for other counseling recommendations.  Return in  about 2 weeks (around 04/14/2016) for LOB.   Marny LowensteinJulie N Hanan Moen, PA-C

## 2016-03-31 NOTE — Patient Instructions (Signed)
Introduction Patient Name: ________________________________________________ Patient Due Date: ____________________ What is a fetal movement count? A fetal movement count is the number of times that you feel your baby move during a certain amount of time. This may also be called a fetal kick count. A fetal movement count is recommended for every pregnant woman. You may be asked to start counting fetal movements as early as week 28 of your pregnancy. Pay attention to when your baby is most active. You may notice your baby's sleep and wake cycles. You may also notice things that make your baby move more. You should do a fetal movement count:  When your baby is normally most active.  At the same time each day. A good time to count movements is while you are resting, after having something to eat and drink. How do I count fetal movements? 1. Find a quiet, comfortable area. Sit, or lie down on your side. 2. Write down the date, the start time and stop time, and the number of movements that you felt between those two times. Take this information with you to your health care visits. 3. For 2 hours, count kicks, flutters, swishes, rolls, and jabs. You should feel at least 10 movements during 2 hours. 4. You may stop counting after you have felt 10 movements. 5. If you do not feel 10 movements in 2 hours, have something to eat and drink. Then, keep resting and counting for 1 hour. If you feel at least 4 movements during that hour, you may stop counting. Contact a health care provider if:  You feel fewer than 4 movements in 2 hours.  Your baby is not moving like he or she usually does. Date: ____________ Start time: ____________ Stop time: ____________ Movements: ____________ Date: ____________ Start time: ____________ Stop time: ____________ Movements: ____________ Date: ____________ Start time: ____________ Stop time: ____________ Movements: ____________ Date: ____________ Start time: ____________  Stop time: ____________ Movements: ____________ Date: ____________ Start time: ____________ Stop time: ____________ Movements: ____________ Date: ____________ Start time: ____________ Stop time: ____________ Movements: ____________ Date: ____________ Start time: ____________ Stop time: ____________ Movements: ____________ Date: ____________ Start time: ____________ Stop time: ____________ Movements: ____________ Date: ____________ Start time: ____________ Stop time: ____________ Movements: ____________ This information is not intended to replace advice given to you by your health care provider. Make sure you discuss any questions you have with your health care provider. Document Released: 03/09/2006 Document Revised: 10/07/2015 Document Reviewed: 03/19/2015 Elsevier Interactive Patient Education  2017 [employer] Braxton Hicks Contractions Contractions of the uterus can occur throughout pregnancy. Contractions are not always a sign that you are in labor.  WHAT ARE BRAXTON HICKS CONTRACTIONS?  Contractions that occur before labor are called Braxton Hicks contractions, or false labor. Toward the end of pregnancy (32-34 weeks), these contractions can develop more often and may become more forceful. This is not true labor because these contractions do not result in opening (dilatation) and thinning of the cervix. They are sometimes difficult to tell apart from true labor because these contractions can be forceful and people have different pain tolerances. You should not feel embarrassed if you go to the hospital with false labor. Sometimes, the only way to tell if you are in true labor is for your health care provider to look for changes in the cervix. If there are no prenatal problems or other health problems associated with the pregnancy, it is completely safe to be sent home with false labor and await the onset of true labor.   HOW CAN YOU TELL THE DIFFERENCE BETWEEN TRUE AND FALSE LABOR? False Labor     The contractions of false labor are usually shorter and not as hard as those of true labor.   The contractions are usually irregular.   The contractions are often felt in the front of the lower abdomen and in the groin.   The contractions may go away when you walk around or change positions while lying down.   The contractions get weaker and are shorter lasting as time goes on.   The contractions do not usually become progressively stronger, regular, and closer together as with true labor.  True Labor   Contractions in true labor last 30-70 seconds, become very regular, usually become more intense, and increase in frequency.   The contractions do not go away with walking.   The discomfort is usually felt in the top of the uterus and spreads to the lower abdomen and low back.   True labor can be determined by your health care provider with an exam. This will show that the cervix is dilating and getting thinner.  WHAT TO REMEMBER  Keep up with your usual exercises and follow other instructions given by your health care provider.   Take medicines as directed by your health care provider.   Keep your regular prenatal appointments.   Eat and drink lightly if you think you are going into labor.   If Braxton Hicks contractions are making you uncomfortable:   Change your position from lying down or resting to walking, or from walking to resting.   Sit and rest in a tub of warm water.   Drink 2-3 glasses of water. Dehydration may cause these contractions.   Do slow and deep breathing several times an hour.  WHEN SHOULD I SEEK IMMEDIATE MEDICAL CARE? Seek immediate medical care if:  Your contractions become stronger, more regular, and closer together.   You have fluid leaking or gushing from your vagina.   You have a fever.   You pass blood-tinged mucus.   You have vaginal bleeding.   You have continuous abdominal pain.   You have low back pain  that you never had before.   You feel your baby's head pushing down and causing pelvic pressure.   Your baby is not moving as much as it used to.  This information is not intended to replace advice given to you by your health care provider. Make sure you discuss any questions you have with your health care provider. Document Released: 02/07/2005 Document Revised: 06/01/2015 Document Reviewed: 11/19/2012 Elsevier Interactive Patient Education  2017 [employer]  

## 2016-04-14 ENCOUNTER — Other Ambulatory Visit (HOSPITAL_COMMUNITY)
Admission: RE | Admit: 2016-04-14 | Discharge: 2016-04-14 | Disposition: A | Discharge status: Home / Self Care | Payer: BLUE CROSS/BLUE SHIELD | Source: Ambulatory Visit | Attending: Advanced Practice Midwife | Admitting: Advanced Practice Midwife

## 2016-04-14 ENCOUNTER — Ambulatory Visit (INDEPENDENT_AMBULATORY_CARE_PROVIDER_SITE_OTHER): Payer: Medicaid Other | Admitting: Advanced Practice Midwife

## 2016-04-14 VITALS — BP 127/88 | HR 68 | Wt 180.0 lb

## 2016-04-14 DIAGNOSIS — Z113 Encounter for screening for infections with a predominantly sexual mode of transmission: Secondary | ICD-10-CM | POA: Diagnosis not present

## 2016-04-14 DIAGNOSIS — Z603 Acculturation difficulty: Secondary | ICD-10-CM

## 2016-04-14 DIAGNOSIS — Z3403 Encounter for supervision of normal first pregnancy, third trimester: Secondary | ICD-10-CM

## 2016-04-14 LAB — OB RESULTS CONSOLE GBS: STREP GROUP B AG: NEGATIVE

## 2016-04-14 NOTE — Progress Notes (Signed)
   PRENATAL VISIT NOTE  Subjective:  Courtney Hodge is a 26 y.o. [id number] at 10371w5d being seen today for ongoing prenatal care.  She is currently monitored for the following issues for this low-risk pregnancy and has Supervision of low-risk pregnancy; Language barrier, cultural differences; Rh negative state in antepartum period; and Abnormal fetal ultrasound on her problem list.  Patient reports fatigue and cramping while standing for hours at work, desires to be out of work until the baby is born.  Contractions: Not present. Vag. Bleeding: None.  Movement: Present. Denies leaking of fluid.   The following portions of the patient's history were reviewed and updated as appropriate: allergies, current medications, past family history, past medical history, past social history, past surgical history and problem list. Problem list updated.  Objective:   Vitals:   04/14/16 0909  BP: 127/88  Pulse: 68  Weight: 180 lb (81.6 kg)    Fetal Status: Fetal Heart Rate (bpm): 130 Fundal Height: 36 cm Movement: Present  Presentation: Vertex  General:  Alert, oriented and cooperative. Patient is in no acute distress.  Skin: Skin is warm and dry. No rash noted.   Cardiovascular: Normal heart rate noted  Respiratory: Normal respiratory effort, no problems with respiration noted  Abdomen: Soft, gravid, appropriate for gestational age. Pain/Pressure: Absent     Pelvic:  Cervical exam performed Dilation: Closed Effacement (%): 70 Station: -3  Extremities: Normal range of motion.  Edema: None  Mental Status: Normal mood and affect. Normal behavior. Normal judgment and thought content.   Assessment and Plan:  Pregnancy: G5P4004 at 2271w5d  1. Supervision of low-risk first pregnancy, third trimester --Desires to be out of work until delivery. No medical indication for FMLA, offered pt letter stating pt uncomfortable and may be unable to perform her physical job, and her work may not be able to pay her for FMLA  time but may give her unpaid leave.  Pt declines and will continue to work.  After discussing further, with Swahili interpreter in room, pt concerned that her work is 1.5 hours from home and she does not want to go into labor while at work.  Recommend she go to nearest hospital if needed for emergencies. - [facility]lam[facility] - Culture, beta strep (group b only)  2. Language barrier, cultural differences --Swahili interpreter present for all communication.  Term labor symptoms and general obstetric precautions including but not limited to vaginal bleeding, contractions, leaking of fluid and fetal movement were reviewed in detail with the patient. Please refer to After Visit Summary for other counseling recommendations.  Return in about 1 week (around 04/21/2016).   Hurshel PartyLisa A Leftwich-Kirby, CNM

## 2016-04-14 NOTE — Patient Instructions (Signed)
Third Trimester of Pregnancy The third trimester is from week 29 through week 40 (months 7 through 9). The third trimester is a time when the unborn baby (fetus) is growing rapidly. At the end of the ninth month, the fetus is about 20 inches in length and weighs 6-10 pounds. Body changes during your third trimester Your body goes through many changes during pregnancy. The changes vary from woman to woman. During the third trimester:  Your weight will continue to increase. You can expect to gain 25-35 pounds (11-16 kg) by the end of the pregnancy.  You may begin to get stretch marks on your hips, abdomen, and breasts.  You may urinate more often because the fetus is moving lower into your pelvis and pressing on your bladder.  You may develop or continue to have heartburn. This is caused by increased hormones that slow down muscles in the digestive tract.  You may develop or continue to have constipation because increased hormones slow digestion and cause the muscles that push waste through your intestines to relax.  You may develop hemorrhoids. These are swollen veins (varicose veins) in the rectum that can itch or be painful.  You may develop swollen, bulging veins (varicose veins) in your legs.  You may have increased body aches in the pelvis, back, or thighs. This is due to weight gain and increased hormones that are relaxing your joints.  You may have changes in your hair. These can include thickening of your hair, rapid growth, and changes in texture. Some women also have hair loss during or after pregnancy, or hair that feels dry or thin. Your hair will most likely return to normal after your baby is born.  Your breasts will continue to grow and they will continue to become tender. A yellow fluid (colostrum) may leak from your breasts. This is the first milk you are producing for your baby.  Your belly button may stick out.  You may notice more swelling in your hands, face, or  ankles.  You may have increased tingling or numbness in your hands, arms, and legs. The skin on your belly may also feel numb.  You may feel short of breath because of your expanding uterus.  You may have more problems sleeping. This can be caused by the size of your belly, increased need to urinate, and an increase in your body's metabolism.  You may notice the fetus "dropping," or moving lower in your abdomen.  You may have increased vaginal discharge.  Your cervix becomes thin and soft (effaced) near your due date. What to expect at prenatal visits You will have prenatal exams every 2 weeks until week 36. Then you will have weekly prenatal exams. During a routine prenatal visit:  You will be weighed to make sure you and the fetus are growing normally.  Your blood pressure will be taken.  Your abdomen will be measured to track your baby's growth.  The fetal heartbeat will be listened to.  Any test results from the previous visit will be discussed.  You may have a cervical check near your due date to see if you have effaced. At around 36 weeks, your health care provider will check your cervix. At the same time, your health care provider will also perform a test on the secretions of the vaginal tissue. This test is to determine if a type of bacteria, Group B streptococcus, is present. Your health care provider will explain this further. Your health care provider may ask you:    What your birth plan is.  How you are feeling.  If you are feeling the baby move.  If you have had any abnormal symptoms, such as leaking fluid, bleeding, severe headaches, or abdominal cramping.  If you are using any tobacco products, including cigarettes, chewing tobacco, and electronic cigarettes.  If you have any questions. Other tests or screenings that may be performed during your third trimester include:  Blood tests that check for low iron levels (anemia).  Fetal testing to check the health,  activity level, and growth of the fetus. Testing is done if you have certain medical conditions or if there are problems during the pregnancy.  Nonstress test (NST). This test checks the health of your baby to make sure there are no signs of problems, such as the baby not getting enough oxygen. During this test, a belt is placed around your belly. The baby is made to move, and its heart rate is monitored during movement. What is false labor? False labor is a condition in which you feel small, irregular tightenings of the muscles in the womb (contractions) that eventually go away. These are called Braxton Hicks contractions. Contractions may last for hours, days, or even weeks before true labor sets in. If contractions come at regular intervals, become more frequent, increase in intensity, or become painful, you should see your health care provider. What are the signs of labor?  Abdominal cramps.  Regular contractions that start at 10 minutes apart and become stronger and more frequent with time.  Contractions that start on the top of the uterus and spread down to the lower abdomen and back.  Increased pelvic pressure and dull back pain.  A watery or bloody mucus discharge that comes from the vagina.  Leaking of amniotic fluid. This is also known as your "water breaking." It could be a slow trickle or a gush. Let your doctor know if it has a color or strange odor. If you have any of these signs, call your health care provider right away, even if it is before your due date. Follow these instructions at home: Eating and drinking  Continue to eat regular, healthy meals.  Do not eat:  Raw meat or meat spreads.  Unpasteurized milk or cheese.  Unpasteurized juice.  Store-made salad.  Refrigerated smoked seafood.  Hot dogs or deli meat, unless they are piping hot.  More than 6 ounces of albacore tuna a week.  Shark, swordfish, king mackerel, or tile fish.  Store-made salads.  Raw  sprouts, such as mung bean or alfalfa sprouts.  Take prenatal vitamins as told by your health care provider.  Take 1000 mg of calcium daily as told by your health care provider.  If you develop constipation:  Take over-the-counter or prescription medicines.  Drink enough fluid to keep your urine clear or pale yellow.  Eat foods that are high in fiber, such as fresh fruits and vegetables, whole grains, and beans.  Limit foods that are high in fat and processed sugars, such as fried and sweet foods. Activity  Exercise only as directed by your health care provider. Healthy pregnant women should aim for 2 hours and 30 minutes of moderate exercise per week. If you experience any pain or discomfort while exercising, stop.  Avoid heavy lifting.  Do not exercise in extreme heat or humidity, or at high altitudes.  Wear low-heel, comfortable shoes.  Practice good posture.  Do not travel far distances unless it is absolutely necessary and only with the approval   of your health care provider.  Wear your seat belt at all times while in a car, on a bus, or on a plane.  Take frequent breaks and rest with your legs elevated if you have leg cramps or low back pain.  Do not use hot tubs, steam rooms, or saunas.  You may continue to have sex unless your health care provider tells you otherwise. Lifestyle  Do not use any products that contain nicotine or tobacco, such as cigarettes and e-cigarettes. If you need help quitting, ask your health care provider.  Do not drink alcohol.  Do not use any medicinal herbs or unprescribed drugs. These chemicals affect the formation and growth of the baby.  If you develop varicose veins:  Wear support pantyhose or compression stockings as told by your healthcare provider.  Elevate your feet for 15 minutes, 3-4 times a day.  Wear a supportive maternity bra to help with breast tenderness. General instructions  Take over-the-counter and prescription  medicines only as told by your health care provider. There are medicines that are either safe or unsafe to take during pregnancy.  Take warm sitz baths to soothe any pain or discomfort caused by hemorrhoids. Use hemorrhoid cream or witch hazel if your health care provider approves.  Avoid cat litter boxes and soil used by cats. These carry germs that can cause birth defects in the baby. If you have a cat, ask someone to clean the litter box for you.  To prepare for the arrival of your baby:  Take prenatal classes to understand, practice, and ask questions about the labor and delivery.  Make a trial run to the hospital.  Visit the hospital and tour the maternity area.  Arrange for maternity or paternity leave through employers.  Arrange for family and friends to take care of pets while you are in the hospital.  Purchase a rear-facing car seat and make sure you know how to install it in your car.  Pack your hospital bag.  Prepare the baby's nursery. Make sure to remove all pillows and stuffed animals from the baby's crib to prevent suffocation.  Visit your dentist if you have not gone during your pregnancy. Use a soft toothbrush to brush your teeth and be gentle when you floss.  Keep all prenatal follow-up visits as told by your health care provider. This is important. Contact a health care provider if:  You are unsure if you are in labor or if your water has broken.  You become dizzy.  You have mild pelvic cramps, pelvic pressure, or nagging pain in your abdominal area.  You have lower back pain.  You have persistent nausea, vomiting, or diarrhea.  You have an unusual or bad smelling vaginal discharge.  You have pain when you urinate. Get help right away if:  You have a fever.  You are leaking fluid from your vagina.  You have spotting or bleeding from your vagina.  You have severe abdominal pain or cramping.  You have rapid weight loss or weight gain.  You have  shortness of breath with chest pain.  You notice sudden or extreme swelling of your face, hands, ankles, feet, or legs.  Your baby makes fewer than 10 movements in 2 hours.  You have severe headaches that do not go away with medicine.  You have vision changes. Summary  The third trimester is from week 29 through week 40, months 7 through 9. The third trimester is a time when the unborn baby (fetus)   is growing rapidly.  During the third trimester, your discomfort may increase as you and your baby continue to gain weight. You may have abdominal, leg, and back pain, sleeping problems, and an increased need to urinate.  During the third trimester your breasts will keep growing and they will continue to become tender. A yellow fluid (colostrum) may leak from your breasts. This is the first milk you are producing for your baby.  False labor is a condition in which you feel small, irregular tightenings of the muscles in the womb (contractions) that eventually go away. These are called Braxton Hicks contractions. Contractions may last for hours, days, or even weeks before true labor sets in.  Signs of labor can include: abdominal cramps; regular contractions that start at 10 minutes apart and become stronger and more frequent with time; watery or bloody mucus discharge that comes from the vagina; increased pelvic pressure and dull back pain; and leaking of amniotic fluid. This information is not intended to replace advice given to you by your health care provider. Make sure you discuss any questions you have with your health care provider. Document Released: 02/01/2001 Document Revised: 07/16/2015 Document Reviewed: 04/10/2012 Elsevier Interactive Patient Education  2017 Elsevier Inc.  

## 2016-04-15 LAB — GC/CHLAMYDIA PROBE AMP (~~LOC~~) NOT AT ARMC
Chlamydia: NEGATIVE
Neisseria Gonorrhea: NEGATIVE

## 2016-04-18 LAB — CULTURE, BETA STREP (GROUP B ONLY): Strep Gp B Culture: NEGATIVE

## 2016-04-20 ENCOUNTER — Encounter: Payer: Self-pay | Admitting: Advanced Practice Midwife

## 2016-04-20 ENCOUNTER — Ambulatory Visit (INDEPENDENT_AMBULATORY_CARE_PROVIDER_SITE_OTHER): Payer: Medicaid Other | Admitting: Advanced Practice Midwife

## 2016-04-20 DIAGNOSIS — O283 Abnormal ultrasonic finding on antenatal screening of mother: Secondary | ICD-10-CM

## 2016-04-20 DIAGNOSIS — Z3493 Encounter for supervision of normal pregnancy, unspecified, third trimester: Secondary | ICD-10-CM

## 2016-04-20 NOTE — Progress Notes (Signed)
   PRENATAL VISIT NOTE  Subjective:  Courtney Hodge is a 26 y.o. [id number] at 1245w4d being seen today for ongoing prenatal care.  She is currently monitored for the following issues for this low-risk pregnancy and has Supervision of low-risk pregnancy; Language barrier, cultural differences; Rh negative state in antepartum period; and Abnormal fetal ultrasound on her problem list.  Patient reports no complaints.  Contractions: Not present. Vag. Bleeding: None.  Movement: Present. Denies leaking of fluid.   The following portions of the patient's history were reviewed and updated as appropriate: allergies, current medications, past family history, past medical history, past social history, past surgical history and problem list. Problem list updated.  Objective:   Vitals:   04/20/16 0833  BP: 130/82  Pulse: 82  Weight: 179 lb 4.8 oz (81.3 kg)    Fetal Status: Fetal Heart Rate (bpm): 132 Fundal Height: 38 cm Movement: Present  Presentation: Vertex  General:  Alert, oriented and cooperative. Patient is in no acute distress.  Skin: Skin is warm and dry. No rash noted.   Cardiovascular: Normal heart rate noted  Respiratory: Normal respiratory effort, no problems with respiration noted  Abdomen: Soft, gravid, appropriate for gestational age. Pain/Pressure: Absent     Pelvic:  Cervical exam performed       0/thick/-3  Extremities: Normal range of motion.  Edema: Trace  Mental Status: Normal mood and affect. Normal behavior. Normal judgment and thought content.   Assessment and Plan:  Pregnancy: G5P4004 at 6045w4d  1. Encounter for supervision of low-risk pregnancy in third trimester   2. Abnormal fetal ultrasound F/u normal  Term labor symptoms and general obstetric precautions including but not limited to vaginal bleeding, contractions, leaking of fluid and fetal movement were reviewed in detail with the patient. Please refer to After Visit Summary for other counseling recommendations.    Return in about 1 week (around 04/27/2016) for ROB.   Garrel Ridgelachel M Kavina Cantave, RN

## 2016-04-20 NOTE — Patient Instructions (Signed)

## 2016-04-27 ENCOUNTER — Ambulatory Visit (INDEPENDENT_AMBULATORY_CARE_PROVIDER_SITE_OTHER): Payer: Medicaid Other | Admitting: Advanced Practice Midwife

## 2016-04-27 ENCOUNTER — Encounter: Payer: Self-pay | Admitting: Obstetrics and Gynecology

## 2016-04-27 VITALS — BP 123/81 | HR 84 | Wt 181.0 lb

## 2016-04-27 DIAGNOSIS — Z3493 Encounter for supervision of normal pregnancy, unspecified, third trimester: Secondary | ICD-10-CM

## 2016-04-27 DIAGNOSIS — Z603 Acculturation difficulty: Secondary | ICD-10-CM

## 2016-04-27 DIAGNOSIS — Z3483 Encounter for supervision of other normal pregnancy, third trimester: Secondary | ICD-10-CM

## 2016-04-27 NOTE — Patient Instructions (Addendum)
trimester Hodge ya tatu Mwili wako utaendelea kupitia mabadiliko mengi wakati wa ujauzito. Mabadiliko hutofautiana kutoka mwanamke hadi mwanamke. Courtney Hodge wa tatu ya trimester: Courtney CollumUzito wako utaendelea kuongezeka. Unaweza kutarajia kupata pounds 25-35 (11-16 kg) mwishoni mwa ujauzito. Courtney RandUnaweza kuanza kupata alama za Rodeykunyoosha kwenye vidonda vyako, tumbo, na matiti. Courtney CuretUnaweza Milus Heightkukimbia kwa mara nyingi kwa sababu fetusi inapita chini ndani ya pelvis Hodge na kuimarisha kinga Hodge. Courtney DandyUnaweza kuendeleza au kuendelea kuwa na upungufu wa moyo. Hii inasababishwa na homoni zilizoongezeka zinazopungua misuli katika njia ya utumbo. Courtney DandyUnaweza kuendeleza au Hillery Aldokuendelea kuwa na kuvimbiwa kwa sababu homoni zilizoongezeka hupunguza digestion na kusababisha misuli inayochochea taka Courtney Hodge matumbo Courtney Hodge kupumzika. Courtney DandyUnaweza kuendeleza hemorrhoids. Hizi ni mishipa ya kuvimba (mishipa ya varicose) Courtney Hodge rectum ambayo inaweza kupiga au kuwa chungu. Courtney MonarchUnaweza kuendeleza kuvimba, mishipa yenye mishipa (mishipa ya varicose) kwenye miguu Hodge. Huenda umeongezeka kwa mazao ya mwili kwenye pelvis, nyuma, au mapaja. Hii ni kutokana na kupata uzito na homoni zilizoongezeka ambazo zinafurahia viungo vyako. Unaweza kuwa na mabadiliko katika nywele zako. Hizi zinaweza kuunganisha nywele zako, Courtney Hodge, na mabadiliko katika texture. [place] wengine pia hupoteza nywele wakati au baada ya ujauzito, au nywele ambazo huhisi kuwa kavu au nyembamba. Nywele zako Courtney Hodge kuzaliwa. Matiti Hodge itaendelea Courtney Hodge na wataendelea kuwa zabuni. Maji ya njano (rangi) yanaweza kuvuja kutoka kwenye matiti Courtney Hodge. Huu ndio maziwa ya kwanza unayozalisha kwa mtoto wako. Kifungo chako cha tumbo kinaweza kufungwa nje. Unaweza kuona uvimbe zaidi Courtney Hodge, uso, au vidole. Huenda umeongeza kuunganisha au Courtney Health Solutionskupoteza mikono, mikono, na miguu. Ngozi ya tumbo Hodge inaweza pia Courtney Hodge. Courtney CuretUnaweza kujisikia kupumua kwa sababu ya  Courtney Hodge ya Courtney Leonekupanua. Courtney GramsUnaweza kuwa na matatizo zaidi Landscape architectya kulala. Hii inaweza kusababishwa na Courtney Hodge wa tumbo lako, haja ya Broomfieldkuongeza urinate, na ongezeko la kimetaboliki ya mwili wako. Courtney CraftUnaweza kuona fetusi "Somaliakuacha," au kusonga chini ndani ya tumbo lako Conway(kuangaza). Unaweza kuongezeka kwa kutokwa kwa uke. Courtney CraftUnaweza kuona viungo vyako Courtney Hodge huru na unaweza kuwa na maumivu karibu na mfupa wako wa pelvic. Nini cha kutarajia wakati wa kutembelea kabla ya kujifungua Utaku[place] na mitihani ya ujauzito kila baada ya wiki 2 mpaka juma 36. Kisha utakuwa na mitihani ya kila wiki kabla ya kujifungua. Courtney Hodge wa ziara ya Courtney Hodge kabla ya Courtney Hodge: Utakuwa kipimo ili Courtney Hodge wewe na mtoto Courtney Sierrasunakua kawaida. Shinikizo la damu itachukuliwa. Mimba Hodge itahesabiwa kufuatilia ukuaji wa mtoto wako. Pigo la moyo la fetasi litasikilizwa. Matokeo yoyote ya mtihani kutoka kwa ziara ya awali itajadiliwa. Courtney GramsUnaweza kuwa na hundi ya kizazi karibu na tarehe Courtney Hodge ya kutosha ili kuona ikiwa kizazi chako kilichochea au kilichochelewa (kilichoharibika). Utajaribiwa kwa streptococcus ya Kundi B. Hii hutokea kati ya wiki 35 na 37. Mtoa huduma wako wa afya anaweza Boliviakukuuliza: Mpango wako wa uzazi ni nini. Jinsi unavyohisi. Courtney MonarchIkiwa unahisi hisia za mtoto. Courtney BargesIkiwa umekuwa na dalili zisizo za [name]  Nini cha kutarajia wakati wa kutembelea kabla ya kujifungua Ut[place] na mitihani ya ujauzito kila baada ya wiki 2 mpaka juma 36. Kisha utakuwa na mitihani ya kila wiki kabla ya kujifungua. Courtney Hodge wa ziara ya Courtney Hodge kabla ya Courtney Hodge: Utakuwa kipimo ili Courtney Hodge wewe na mtoto Courtney Sierrasunakua kawaida. Shinikizo la damu itachukuliwa. Mimba Hodge itahesabiwa kufuatilia ukuaji wa mtoto wako. Pigo la moyo la fetasi litasikilizwa. Matokeo yoyote ya mtihani kutoka kwa ziara ya awali itajadiliwa. Courtney GramsUnaweza kuwa na hundi ya kizazi karibu na tarehe Courtney Hodge ya kutosha ili kuona ikiwa kizazi chako kilichochea au kilichochelewa (kilichoharibika). Utajaribiwa  kwa streptococcus ya Kundi B. Hii hutokea kati ya wiki 35 na 37. Mtoa huduma  wako wa afya anaweza kukuuliza: Mpango wako wa uzazi ni nini. Jinsi unavyohisi. Courtney Hodge unahisi hisia za mtoto. Courtney Hodge na dalili zisizo za Courtney Hodge, kama vile maji [name], [name], maumivu ya [name], au Courtney Hodge kwa tumbo. Ikiwa unatumia bidhaa yoyote ya tumbaku, ikiwa ni pamoja na sigara, tumbaku ya [place], na sigara za elektroniki. Ikiwa una maswali yoyote. Vipimo vingine au uchunguzi ambao Courtney Hodge wa trimester Hodge ya tatu ni pamoja na: Vipimo vya damu vinavyoangalia kiwango cha chini cha chuma (anemia). Upimaji wa fetasi kuangalia afya, kiwango cha shughuli, na ukuaji wa fetusi. Upimaji unafanyika ikiwa una hali fulani ya matibabu au ikiwa Courtney Hodge matatizo wakati wa ujauzito. Mtihani usio na mafanikio (NST). Jaribio hili hunashughulikia afya ya mtoto wako ili kuhakikisha kuwa hakuna dalili Courtney Hodge, Courtney Hodge vile mtoto asiyepata oksijeni ya Courtney Hodge. Courtney Rich wa mtihani huu, ukanda unawekwa karibu na tumbo lako. Mtoto hutolewa, na kiwango cha moyo wake hufuatiliwa wakati wa [name] Kazi ya uongo ni nini? Kazi ya uongo ni hali ambayo huhisi Courtney Hodge, usio wa Courtney Hodge wa misuli ndani ya tumbo (contractions) ambayo kwa kawaida huenda na [name], [name], au maji ya [name]. Hizi huitwa contraction za Courtney High. Mipangilio inaweza kudumu kwa masaa, siku, au hata wiki kabla ya kazi ya kweli iingie. [name] vikwazo vinakuja mara kwa [place], kuwa mara kwa mara zaidi, [place] kwa nguvu, au [place], [place] mtoa [place] wa afya. Ni ishara za kazi? Mimba ya tumbo. Vikwazo vya Annabell Howells vinavyoanza kwa dakika 10 mbali na kuwa na nguvu na mara kwa mara na wakati. Vipande vinavyoanza juu ya uterasi na kuenea chini ya tumbo na nyuma. Kuongezeka kwa shinikizo la pelvic na maumivu ya nyuma. Umwagaji wa maji wa masi au umwagaji damu ambao unatokana na uke. Kuvuja kwa maji ya  amniotic. Hii pia inajulikana kama "kuvunja maji" kwako. Inaweza kuwa polepole au kupungua. Hebu mtoa huduma wako wa afya kujua kama ina rangi au harufu ya ajabu. Courtney Hodge una dalili hizi, piga simu mtoa huduma wako wa afya mara moja, hata kama ni kabla ya tarehe [employer]. Fuata maelekezo haya nyumbani: Jana Half maagizo Hodge ya mtoa huduma ya afya kuhusu matumizi ya dawa. Dawa maalum inaweza kuwa salama au salama kuchukua wakati wa ujauzito. [place] vitamini kabla ya kuzaa ambayo ina angalau micrograms 600 (mcg) ya folic acid Fae Pippin, jaribu Reunion softener ya kinyesi ikiwa mtoa huduma wako wa afya anaidhinisha. Kula na kunywa Kula chakula bora ambacho kinajumuisha matunda na mboga, nafaka nzima, vyanzo vyema vya protini kama vile nyama, mayai, au tofu, na maziwa ya chini. Mtoa huduma wako wa afya atakusaidia [place] kiwango cha uzito Vici. [place] nyama ghafi na jibini usiochushwa. Hizi hubeba virusi ambazo zinaweza kusababisha kasoro za kuzaliwa kwa mtoto. [place] Flow wa chini wa kalsiamu kutoka kwa [place], wasema mtoa huduma wako wa afya kuhusu kama unapaswa kuchukua ziada [place]. Kula chakula cha nne au tano kidogo badala ya [place] tatu kwa siku. Weka vyakula vyenye mafuta na sukari iliyosafishwa, kama vile vyakula vya kukaanga na vitamu. Ili Courtney Hodge kuvimbiwa: Tomi Likens maji ya [employer] mkojo wako wazi au rangi njano. Kula vyakula vilivyo juu sana, kama vile matunda na [address], nafaka nzima, na [place]. Shughuli Zoezi tu kama ilivyoagizwa na mtoa huduma wako wa afya. [place] wengi wanaweza kuendelea na mazoea yao ya kawaida wakati wa ujauzito. Jaribu kufanya mazoezi kwa dakika 30 angalau siku 5 kwa wiki. Tawana Scale mazoezi ikiwa unapata vikwazo vya Leonidas Romberg. [place] kuinua nzito. Usitumike katika joto [employer], au [place] urefu  wa juu. Kuvaa kisigino, viatu vizuri. Tumia mkao mzuri. Alcus Dad ngono isipokuwa  mtoa huduma wako wa afya atakuambia vinginevyo. Ambrose [place] na maumivu na wasiwasi [place] mapumziko ya [place] kwa mara na kupumzika na miguu Hodge imeinua ikiwa una miguu ya miguu au maumivu ya chini ya nyuma. [place]uch[place] bahari ya joto ya bahari ili kupunguza maumivu yoyote au usumbufu unaosababishwa na tumbo. Tumia cream ya hemorrhoid ikiwa mtoa huduma wako wa afya anaidhinisha. Vaa bra nzuri ya kusaidia Courtney Hodge usumbufu kutoka kwa upole wa matiti. Courtney Hodge unaendeleza mishipa ya vurugu: Kuvaa pantyhose au vifuniko vya ukandamizaji kama ilivyoambiwa na mtoa huduma wako wa afya. Kuinua miguu Hodge kwa dakika 15, mara 3-4 kwa siku. Huduma ya ujauzito [employer]. [place] kwenye ziara zako za ujauzito. Weka ziara Hodge yote ya ujauzito kama ilivyoambiwa na mtoa huduma wako wa afya. Hii ni muhimu. Usalama Vaa ukanda wako wa kiti wakati wote unapoendesha gari. Fanya orodha ya namba za dharura za simu, ikiwa ni pamoja namba za [place], [place], [place], na idara za polisi na moto. Huduma ya ujauzito [employer]. [place] kwenye ziara zako za ujauzito. Weka ziara Hodge yote ya ujauzito kama ilivyoambiwa na mtoa huduma wako wa afya. Hii ni muhimu. Usalama Vaa ukanda wako wa kiti wakati wote unapoendesha gari. Fanya orodha ya namba za dharura za simu, ikiwa ni pamoja namba za [place], [place], [place], na idara za polisi na moto. Maelekezo ya jumla [place] masanduku ya [place] na udongo wa paka. Hizi hubeba virusi ambazo zinaweza kusababisha kasoro za kuzaliwa kwa mtoto. Barnett Hatter paka, muulize mtu kusafisha sanduku la [place]. [place] wa mbali isipokuwa ni lazima kabisa na kwa idhini ya mtoa huduma wako wa afya. Usitumie bafuni za moto, vyumba vya mvuke, au saunas. Usinywe pombe. Usitumie bidhaa yoyote zinazo na nikotini au tumbaku, kama vile sigara na e-sigara. Ikiwa unahitaji msaada wa kushoto, uulize mtoa huduma wako wa afya. Usitumie mimea yoyote ya dawa au dawa  zisizochaguliwa. Kemikali hizi zinaathiri malezi na ukuaji wa mtoto. Usiache au kutumia tampons au usafi wa usafi wa usafi. Usivuka miguu Hodge kwa muda mrefu. Kuandaa kwa ajili ya [place] kwa mtoto wako: Henrene Pastor madarasa ya kujifungua kabla ya [place], kufanya [place], na [place] maswali kuhusu kazi na utoaji. Tengeneza jaribio kwenye hospitali. Tembelea hospitali na tembelea eneo la uzazi. Panga kwa ajili ya uzazi au [place] kupitia waajiri. Panga kwa familia na marafiki [place] wanyama wa kipenzi wakati unapokuwa hospitalini. Ununuzi kiti cha gari cha [place] nyuma na hakikisha unajua jinsi ya kuiweka kwenye gari lako. Pakia mfuko wako wa hospitali. Kuandaa kitalu cha mtoto. Lelon Huh [place] mito yote na wanyama ulioingizwa kutoka kwenye [place] mtoto ili [place]. Tembelea daktari wako wa meno kama hujaenda wakati wa [place]. Tumia dawa ya meno ya laini ili [place] meno Hodge na kuwa mpole wakati unapozunguka. [place] na mtoa huduma ya afya ikiwa: Dolan Amen kama wewe ni Carleene Mains au ikiwa maji Hodge yamevunjika. Unajisikia. Una misuli ya pelvic kali, shinikizo la pelvic, au maumivu ya ngumu katika eneo lako la tumbo. Una maumivu ya nyuma ya nyuma. Una kichefuchefu ya [place], Phyllis Ginger, au [place]. Jacques Navy ya kutosha ya uke ya [place]. Una maumivu wakati unapokwisha. Pata msaada mara moja ikiwa: Maji Hodge huvunja kabla ya wiki 37. Una vipindi vya kawaida chini ya dakika 5 mbali kabla ya wiki 37. Una homa. Unachovuja maji kutoka kwa uke wako. Alben Spittle au kumwagika kutoka kwa uke wako. Una maumivu makali ya tumbo au kuponda. Una kupoteza uzito haraka au kupata uzito. Una pumzi fupi na  maumivu ya kifua. Unaona uvimbe wa ghafla au uliokithiri wa uso wako, mikono, vidole, miguu, au miguu. Mtoto wako hufanya harakati chini ya 10 katika masaa 2. Una maumivu ya kichwa ambayo hayaendi wakati unapopata dawa. Una mabadiliko ya maono. Mu[place]ari The  trimester ya tatu ni kutoka kwa wiki 28 hadi wiki 40, miezi 7 hadi 9. Trimester ya tatu ni wakati ambapo mtoto asiyezaliwa (fetus) anaongezeka kwa kasi. Courtney Rich wa trimester ya tatu, usumbufu wako unaweza Cameron Ali wewe na mtoto wako kuendelea kupata uzito. Unaweza kuwa na tumbo la mimba, mguu, na nyuma, matatizo ya usingizi, na haja ya [place] kwa urinate. Courtney Rich wa tatu trimester matiti Hodge itaendelea kuongezeka na wataendelea kuwa zabuni. Maji ya njano (rangi) yanaweza kuvuja kutoka kwenye matiti [place]. Huu ndio maziwa ya kwanza unayozalisha kwa mtoto wako. Kazi ya Caprice Red ni Reina Fuse unasikia vidogo vidogo visivyo kawaida vya misuli ndani ya tumbo (contractions) ambayo hatimaye huenda. Hizi huitwa contraction za [place]. Mipangilio inaweza kudumu kwa masaa, siku, au hata wiki kabla ya kazi ya kweli inapoingia. Gilford Rile za kazi zinaweza kujumuisha: tumbo la tumbo; vipindi vya Garvin Fila kwa dakika 10 mbali na kuwa na nguvu na mara kwa mara na wakati; kumwagika kwa maji ya damu au damu. kuongezeka kwa shinikizo la pelvic na maumivu ya nyuma; na kuvuja kwa maji ya amniotic. Habari hii haikusudiwa Reunion nafasi ya ushauri uliotolewa na mtoa huduma wako wa afya. Hakikisha kujadili maswali yoyote unayo na mtoa huduma wako wa afya. Hati iliyotolewa: 02/01/2001 Hati iliyorekebishwa: 07/16/2015 Hati Iliyotathmini: 04/10/2012 Elsevier Interactive Patient Education  2017 [employer].

## 2016-04-27 NOTE — Progress Notes (Signed)
   PRENATAL VISIT NOTE  Subjective:  Courtney Hodge is a 26 y.o. [id number] at 8111w4d being seen today for ongoing prenatal care.  She is currently monitored for the following issues for this low-risk pregnancy and has Supervision of low-risk pregnancy; Language barrier, cultural differences; Rh negative state in antepartum period; and Abnormal fetal ultrasound on her problem list.  Patient reports no complaints.  Contractions: Not present. Vag. Bleeding: None.  Movement: Present. Denies leaking of fluid.   The following portions of the patient's history were reviewed and updated as appropriate: allergies, current medications, past family history, past medical history, past social history, past surgical history and problem list. Problem list updated.  Objective:   Vitals:   04/27/16 0941  BP: 123/81  Pulse: 84  Weight: 181 lb (82.1 kg)    Fetal Status: Fetal Heart Rate (bpm): 140 Fundal Height: 38 cm Movement: Present     General:  Alert, oriented and cooperative. Patient is in no acute distress.  Skin: Skin is warm and dry. No rash noted.   Cardiovascular: Normal heart rate noted  Respiratory: Normal respiratory effort, no problems with respiration noted  Abdomen: Soft, gravid, appropriate for gestational age. Pain/Pressure: Absent     Pelvic:  Cervical exam performed at pt request. Dilation: Closed Effacement (%): 70 Station: -3  Extremities: Normal range of motion.  Edema: Trace  Mental Status: Normal mood and affect. Normal behavior. Normal judgment and thought content.   Assessment and Plan:  Pregnancy: [id number] at 3511w4d  1. Encounter for supervision of low-risk pregnancy in third trimester --Pt desires to come out of work. No medical complications.  Offered letter stating pt due date so employer could work with her on when to come out of work.  Pt accepted letter.    2. Language barrier, cultural differences --Swahili language line interpreter used for all communication  Term  labor symptoms and general obstetric precautions including but not limited to vaginal bleeding, contractions, leaking of fluid and fetal movement were reviewed in detail with the patient. Please refer to After Visit Summary for other counseling recommendations.  No Follow-up on file.   Hurshel PartyLisa A Leftwich-Kirby, CNM

## 2016-05-05 ENCOUNTER — Inpatient Hospital Stay (HOSPITAL_COMMUNITY)
Admission: AD | Admit: 2016-05-05 | Discharge: 2016-05-08 | DRG: 775 | Disposition: A | Discharge status: Home / Self Care | Payer: BLUE CROSS/BLUE SHIELD | Source: Ambulatory Visit | Attending: Obstetrics and Gynecology | Admitting: Obstetrics and Gynecology

## 2016-05-05 ENCOUNTER — Encounter (HOSPITAL_COMMUNITY): Payer: Self-pay | Admitting: *Deleted

## 2016-05-05 ENCOUNTER — Ambulatory Visit (INDEPENDENT_AMBULATORY_CARE_PROVIDER_SITE_OTHER): Payer: BLUE CROSS/BLUE SHIELD | Admitting: Medical

## 2016-05-05 VITALS — BP 147/76 | HR 82 | Wt 187.0 lb

## 2016-05-05 DIAGNOSIS — O134 Gestational [pregnancy-induced] hypertension without significant proteinuria, complicating childbirth: Secondary | ICD-10-CM | POA: Diagnosis present

## 2016-05-05 DIAGNOSIS — Z6790 Unspecified blood type, Rh positive: Secondary | ICD-10-CM | POA: Diagnosis not present

## 2016-05-05 DIAGNOSIS — D696 Thrombocytopenia, unspecified: Secondary | ICD-10-CM | POA: Diagnosis present

## 2016-05-05 DIAGNOSIS — O283 Abnormal ultrasonic finding on antenatal screening of mother: Secondary | ICD-10-CM

## 2016-05-05 DIAGNOSIS — Z3493 Encounter for supervision of normal pregnancy, unspecified, third trimester: Secondary | ICD-10-CM

## 2016-05-05 DIAGNOSIS — O9912 Other diseases of the blood and blood-forming organs and certain disorders involving the immune mechanism complicating childbirth: Secondary | ICD-10-CM | POA: Diagnosis present

## 2016-05-05 DIAGNOSIS — Z3483 Encounter for supervision of other normal pregnancy, third trimester: Secondary | ICD-10-CM

## 2016-05-05 DIAGNOSIS — O26893 Other specified pregnancy related conditions, third trimester: Secondary | ICD-10-CM | POA: Diagnosis present

## 2016-05-05 DIAGNOSIS — Z3A39 39 weeks gestation of pregnancy: Secondary | ICD-10-CM | POA: Diagnosis not present

## 2016-05-05 DIAGNOSIS — R030 Elevated blood-pressure reading, without diagnosis of hypertension: Secondary | ICD-10-CM | POA: Diagnosis present

## 2016-05-05 DIAGNOSIS — O26899 Other specified pregnancy related conditions, unspecified trimester: Secondary | ICD-10-CM

## 2016-05-05 DIAGNOSIS — Z6791 Unspecified blood type, Rh negative: Secondary | ICD-10-CM

## 2016-05-05 HISTORY — DX: Other specified health status: Z78.9

## 2016-05-05 LAB — CBC
HEMATOCRIT: 35.2 % — AB (ref 36.0–46.0)
HEMOGLOBIN: 12.8 g/dL (ref 12.0–15.0)
MCH: 31.6 pg (ref 26.0–34.0)
MCHC: 36.4 g/dL — ABNORMAL HIGH (ref 30.0–36.0)
MCV: 86.9 fL (ref 78.0–100.0)
Platelets: 123 10*3/uL — ABNORMAL LOW (ref 150–400)
RBC: 4.05 MIL/uL (ref 3.87–5.11)
RDW: 13.2 % (ref 11.5–15.5)
WBC: 5.5 10*3/uL (ref 4.0–10.5)

## 2016-05-05 LAB — COMPREHENSIVE METABOLIC PANEL
ALBUMIN: 2.9 g/dL — AB (ref 3.5–5.0)
ALT: 21 U/L (ref 14–54)
ANION GAP: 7 (ref 5–15)
AST: 27 U/L (ref 15–41)
Alkaline Phosphatase: 154 U/L — ABNORMAL HIGH (ref 38–126)
BUN: 6 mg/dL (ref 6–20)
CHLORIDE: 108 mmol/L (ref 101–111)
CO2: 21 mmol/L — ABNORMAL LOW (ref 22–32)
Calcium: 8.6 mg/dL — ABNORMAL LOW (ref 8.9–10.3)
Creatinine, Ser: 0.6 mg/dL (ref 0.44–1.00)
Glucose, Bld: 74 mg/dL (ref 65–99)
POTASSIUM: 3.3 mmol/L — AB (ref 3.5–5.1)
Sodium: 136 mmol/L (ref 135–145)
Total Bilirubin: 0.4 mg/dL (ref 0.3–1.2)
Total Protein: 6.9 g/dL (ref 6.5–8.1)

## 2016-05-05 LAB — PROTEIN / CREATININE RATIO, URINE
CREATININE, URINE: 134 mg/dL
PROTEIN CREATININE RATIO: 0.14 mg/mg{creat} (ref 0.00–0.15)
TOTAL PROTEIN, URINE: 19 mg/dL

## 2016-05-05 MED ORDER — OXYCODONE-ACETAMINOPHEN 5-325 MG PO TABS: 2.0000 | ORAL_TABLET | ORAL | Status: DC | PRN

## 2016-05-05 MED ORDER — OXYTOCIN 40 UNITS IN LACTATED RINGERS INFUSION - SIMPLE MED: 2.5000 [IU]/h | INTRAVENOUS | Status: DC

## 2016-05-05 MED ORDER — ACETAMINOPHEN 325 MG PO TABS: 650.0000 mg | ORAL_TABLET | ORAL | Status: DC | PRN

## 2016-05-05 MED ORDER — OXYTOCIN BOLUS FROM INFUSION: 500.0000 mL | Freq: Once | INTRAVENOUS | Status: DC

## 2016-05-05 MED ORDER — MISOPROSTOL 25 MCG QUARTER TABLET
25.0000 ug | ORAL_TABLET | ORAL | Status: DC
  Administered 2016-05-05: 25 ug via VAGINAL
  Filled 2016-05-05: qty 1
  Filled 2016-05-05: qty 0.25

## 2016-05-05 MED ORDER — LIDOCAINE HCL (PF) 1 % IJ SOLN
30.0000 mL | INTRAMUSCULAR | Status: DC | PRN
  Filled 2016-05-05: qty 30

## 2016-05-05 MED ORDER — LACTATED RINGERS IV SOLN
INTRAVENOUS | Status: DC
  Administered 2016-05-05: 14:00:00 via INTRAVENOUS

## 2016-05-05 MED ORDER — OXYCODONE-ACETAMINOPHEN 5-325 MG PO TABS: 1.0000 | ORAL_TABLET | ORAL | Status: DC | PRN

## 2016-05-05 MED ORDER — TERBUTALINE SULFATE 1 MG/ML IJ SOLN
0.2500 mg | Freq: Once | INTRAMUSCULAR | Status: DC | PRN
  Filled 2016-05-05: qty 1

## 2016-05-05 MED ORDER — FENTANYL CITRATE (PF) 100 MCG/2ML IJ SOLN: 100.0000 ug | INTRAMUSCULAR | Status: DC | PRN

## 2016-05-05 MED ORDER — LACTATED RINGERS IV SOLN: 500.0000 mL | INTRAVENOUS | Status: DC | PRN

## 2016-05-05 MED ORDER — OXYTOCIN 40 UNITS IN LACTATED RINGERS INFUSION - SIMPLE MED
1.0000 m[IU]/min | INTRAVENOUS | Status: DC
  Administered 2016-05-05: 2 m[IU]/min via INTRAVENOUS
  Filled 2016-05-05: qty 1000

## 2016-05-05 MED ORDER — FLEET ENEMA 7-19 GM/118ML RE ENEM: 1.0000 | ENEMA | RECTAL | Status: DC | PRN

## 2016-05-05 MED ORDER — ONDANSETRON HCL 4 MG/2ML IJ SOLN: 4.0000 mg | Freq: Four times a day (QID) | INTRAMUSCULAR | Status: DC | PRN

## 2016-05-05 MED ORDER — SOD CITRATE-CITRIC ACID 500-334 MG/5ML PO SOLN: 30.0000 mL | ORAL | Status: DC | PRN

## 2016-05-05 NOTE — Anesthesia Pain Management Evaluation Note (Signed)
  CRNA Pain Management Visit Note  Patient: Courtney Hodge, 26 y.o., female  "Hello I am a [facility]. We have an anesthesia team available at all times to provide care throughout the hospital, including epidural management and anesthesia for C-section. I don't know your plan for the delivery whether it a natural birth, water birth, IV sedation, nitrous supplementation, doula or epidural, but we want to meet your pain goals."   1.Was your pain managed to your expectations on prior hospitalizations?   Yes   2.What is your expectation for pain management during this hospitalization?     Labor support without medications  3.How can we help you reach that goal? Be available  Record the patient's initial score and the patient's pain goal.   Pain: 0  Pain Goal: 5 [facility] wants you to be able to say your pain was always managed very well.  [facility], LLCMERRITT,Wilma Michaelson 05/05/2016

## 2016-05-05 NOTE — MAU Note (Signed)
Pt sent up from clinic for elevated b/p. Denies h/a or pain or visual changes. Good fetal movement reported

## 2016-05-05 NOTE — Progress Notes (Signed)
Vitals:   05/05/16 1829 05/05/16 1904  BP: (!) 147/79 (!) 149/89  Pulse: 79 96  Resp:  18  Temp:     Pt undergoing IOL for GHTN.  She has gotten one vaginal cytotec and has been on Pitocin for 3-4 hours.  Cx is unchanged at 2/50/-3, but feels firm.  We will dc Pitocin and start oral cytotec to soften cx.  Foley place and inflated with 60 cc H20.  Will restart pitocin when foley falls out.

## 2016-05-05 NOTE — H&P (Signed)
LABOR AND DELIVERY ADMISSION HISTORY AND PHYSICAL NOTE  Courtney Hodge is a 26 y.o. female [id number] with IUP at [redacted]w[redacted]d by LMP and 27w [place] presenting for elevated blood pressures. She is asymptomatic.  She is having irregular, but painful contractions.  She reports positive fetal movement. She denies leakage of fluid or vaginal bleeding.  Prenatal History/Complications:  Past Medical History: Past Medical History:  Diagnosis Date  . Medical history non-contributory     Past Surgical History: Past Surgical History:  Procedure Laterality Date  . NO PAST SURGERIES      Obstetrical History: OB History    Gravida Para Term Preterm AB Living   5 4 4  0 0 4   SAB TAB Ectopic Multiple Live Births   0 0 0   4      Social History: Social History   Social History  . Marital status: Married    Spouse name: N/A  . Number of children: N/A  . Years of education: N/A   Social History Main Topics  . Smoking status: Never Smoker  . Smokeless tobacco: Never Used  . Alcohol use No  . Drug use: No  . Sexual activity: Yes    Birth control/ protection: None   Other Topics Concern  . None   Social History Narrative   ** Merged History Encounter **       From [place], does not speak English    Family History: History reviewed. No pertinent family history.  Allergies: No Known Allergies  Prescriptions Prior to Admission  Medication Sig Dispense Refill Last Dose  . Prenatal Multivit-Min-Fe-FA (PRENATAL VITAMINS) 0.8 MG tablet Take 1 tablet by mouth daily. (Patient not taking: Reported on 05/05/2016) 30 tablet 12 Not Taking at Unknown time  . terconazole (TERAZOL 7) 0.4 % vaginal cream Place 1 applicator vaginally at bedtime. (Patient not taking: Reported on 03/15/2016) 45 g 0 Not Taking at Unknown time     Review of Systems   All systems reviewed and negative except as stated in HPI  Blood pressure (!) 137/101, pulse 89, resp. rate 18, last menstrual period 08/01/2015. General  appearance: alert, cooperative and appears stated age Lungs: clear to auscultation bilaterally Heart: regular rate and rhythm Abdomen: soft, non-tender; bowel sounds normal Extremities: No calf swelling or tenderness Presentation: cephalic Fetal monitoring: cat I - baseline 130, mod variability, + accels, no decels Uterine activity: irregular  Dilation: 2 Effacement (%): 60 Station: -3 Exam by:: Rhoden,MD   Prenatal labs: ABO, Rh: O/NEG/-- (12/12 1044) Antibody: NEG (12/12 1044) Rubella: immune RPR: NON REAC (12/12 1044)  HBsAg: NEGATIVE (12/12 1044)  HIV: NONREACTIVE (12/12 1044)  GBS: Negative (02/22 0000)  1 hr Glucola: 100 Genetic screening:  Too late to care Anatomy US: 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, otherwise wnl on follow up  Prenatal Transfer Tool  Maternal Diabetes: No Genetic Screening: too late to care Maternal Ultrasounds/Referrals: Normal Fetal Ultrasounds or other Referrals:  None Maternal Substance Abuse:  No Significant Maternal Medications:  None Significant Maternal Lab Results: Lab values include: Group B Strep negative  Results for orders placed or performed during the hospital encounter of 05/05/16 (from the past 24 hour(s))  Protein / creatinine ratio, urine   Collection Time: 05/05/16 11:42 AM  Result Value Ref Range   Creatinine, Urine 134.00 mg/dL   Total Protein, Urine 19 mg/dL   Protein Creatinine Ratio 0.14 0.00 - 0.15 mg/mg[Cre]  CBC   Collection Time: 05/05/16 12:32 PM  Result Value Ref Range  WBC 5.5 4.0 - 10.5 K/uL   RBC 4.05 3.87 - 5.11 MIL/uL   Hemoglobin 12.8 12.0 - 15.0 g/dL   HCT 96.035.2 (L) 45.436.0 - 09.846.0 %   MCV 86.9 78.0 - 100.0 fL   MCH 31.6 26.0 - 34.0 pg   MCHC 36.4 (H) 30.0 - 36.0 g/dL   RDW 11.913.2 14.711.5 - 82.915.5 %   Platelets 123 (L) 150 - 400 K/uL  Comprehensive metabolic panel   Collection Time: 05/05/16 12:32 PM  Result Value Ref Range   Sodium 136 135 - 145 mmol/L   Potassium 3.3 (L) 3.5 - 5.1 mmol/L    Chloride 108 101 - 111 mmol/L   CO2 21 (L) 22 - 32 mmol/L   Glucose, Bld 74 65 - 99 mg/dL   BUN 6 6 - 20 mg/dL   Creatinine, Ser 5.620.60 0.44 - 1.00 mg/dL   Calcium 8.6 (L) 8.9 - 10.3 mg/dL   Total Protein 6.9 6.5 - 8.1 g/dL   Albumin 2.9 (L) 3.5 - 5.0 g/dL   AST 27 15 - 41 U/L   ALT 21 14 - 54 U/L   Alkaline Phosphatase 154 (H) 38 - 126 U/L   Total Bilirubin 0.4 0.3 - 1.2 mg/dL   GFR calc non Af Amer >60 >60 mL/min   GFR calc Af Amer >60 >60 mL/min   Anion gap 7 5 - 15    Patient Active Problem List   Diagnosis Date Noted  . Abnormal fetal ultrasound 03/08/2016  . Rh negative state in antepartum period 02/19/2016  . Supervision of low-risk pregnancy 02/02/2016  . Language barrier, cultural differences 02/02/2016    Assessment: Courtney Hodge is a 26 y.o. [id number] at 7286w5d here for IOL for gestational hypertension.  #blood pressure:  Labs notable for thrombocytopenia.  No severe range pressures; asymptomatic.  *recheck CMP, CBC this evening  *monitor BP  *IOL as below #Labor: start induction with cytotec, plan to transition to pitocin in 4 hours after patient eats, walks #Pain: All other deliveries without epidural; she is unsure if she wants one this time #FWB: Category I #ID:  none #MOF: breast #MOC:undecided #Circ:  n/a  Courtney Hodge 05/05/2016, 1:21 PM   OB FELLOW HISTORY AND PHYSICAL ATTESTATION  I have seen and examined this patient; I agree with above documentation in the resident's note. Patient to be admitted to L&D 2/2 new diagnosis of gestational HTN, reviewed labs, has a lot platelet of 123, PC 0.14 (normal), all other preE labs normal. Will repeat labs in 12 hours due to thrombocytopenia.    Jen MowElizabeth Jordi Lacko, DO [place] Fellow 05/05/2016

## 2016-05-05 NOTE — Progress Notes (Signed)
LABOR PROGRESS NOTE  Subjective: Doing well, feeling contractions more strongly. Has eaten.  Mostly up and walking around.  Husband will be here in 1-2 hours, does not want to deliver without him.  Objective: BP (!) 141/85   Pulse 97   Temp 98.7 F (37.1 C) (Oral)   Resp 16   Ht 5\' 4"  (1.626 m)   Wt 187 lb (84.8 kg)   LMP 08/01/2015 (Approximate)   BMI 32.10 kg/m    Dilation: 2 Effacement (%): 60 Cervical Position: Middle Station: -3 Presentation: Vertex Exam by:: Dr Elenore Paddyhoden   Assessment / Plan: 26 y.o. [id number] at 248w5d here for IOL for gestational HTN  Labor: IOL, no change with cytotec, will start pitocin with multiparous cervix Fetal Wellbeing:  Category I strip Pain Control:  Natural for now, reviewed options with her Anticipated MOD:  vagnial Gestational HTN:  Preeclampsia labs neg but platelets low... Repeat in 12 hours to monitor for HELLP.  BPs non-severe.  Charlsie MerlesJulia Rhoden, MD 05/05/2016, 6:16 PM

## 2016-05-05 NOTE — Patient Instructions (Signed)
Preeclampsia and Eclampsia  Preeclampsia is a serious condition that develops only during pregnancy. It is also called toxemia of pregnancy. This condition causes high blood pressure along with other symptoms, such as swelling and headaches. These symptoms may develop as the condition gets worse. Preeclampsia may occur at 20 weeks of pregnancy or later.  Diagnosing and treating preeclampsia early is very important. If not treated early, it can cause serious problems for you and your baby. One problem it can lead to is eclampsia, which is a condition that causes muscle jerking or shaking (convulsions or seizures) in the mother. Delivering your baby is the best treatment for preeclampsia or eclampsia. Preeclampsia and eclampsia symptoms usually go away after your baby is born.  What are the causes?  The cause of preeclampsia is not known.  What increases the risk?  The following risk factors make you more likely to develop preeclampsia:  · Being pregnant for the first time.  · Having had preeclampsia during a past pregnancy.  · Having a family history of preeclampsia.  · Having high blood pressure.  · Being pregnant with twins or triplets.  · Being 35 or older.  · Being African-American.  · Having kidney disease or diabetes.  · Having medical conditions such as lupus or blood diseases.  · Being very overweight (obese).    What are the signs or symptoms?  The earliest signs of preeclampsia are:  · High blood pressure.  · Increased protein in your urine. Your health care provider will check for this at every visit before you give birth (prenatal visit).    Other symptoms that may develop as the condition gets worse include:  · Severe headaches.  · Sudden weight gain.  · Swelling of the hands, face, legs, and feet.  · Nausea and vomiting.  · Vision problems, such as blurred or double vision.  · Numbness in the face, arms, legs, and feet.  · Urinating less than usual.  · Dizziness.  · Slurred speech.  · Abdominal pain,  especially upper abdominal pain.  · Convulsions or seizures.    Symptoms generally go away after giving birth.  How is this diagnosed?  There are no screening tests for preeclampsia. Your health care provider will ask you about symptoms and check for signs of preeclampsia during your prenatal visits. You may also have tests that include:  · Urine tests.  · Blood tests.  · Checking your blood pressure.  · Monitoring your baby’s heart rate.  · Ultrasound.    How is this treated?  You and your health care provider will determine the treatment approach that is best for you. Treatment may include:  · Having more frequent prenatal exams to check for signs of preeclampsia, if you have an increased risk for preeclampsia.  · Bed rest.  · Reducing how much salt (sodium) you eat.  · Medicine to lower your blood pressure.  · Staying in the hospital, if your condition is severe. There, treatment will focus on controlling your blood pressure and the amount of fluids in your body (fluid retention).  · You may need to take medicine (magnesium sulfate) to prevent seizures. This medicine may be given as an injection or through an IV tube.  · Delivering your baby early, if your condition gets worse. You may have your labor started with medicine (induced), or you may have a cesarean delivery.    Follow these instructions at home:  Eating and drinking     ·   you need help quitting, ask your health care provider.  Do not use alcohol or drugs.  Avoid stress as much as possible. Rest and get plenty of sleep. General instructions   Take over-the-counter and prescription medicines only as told by your  health care provider.  When lying down, lie on your side. This keeps pressure off of your baby.  When sitting or lying down, raise (elevate) your feet. Try putting some pillows underneath your lower legs.  Exercise regularly. Ask your health care provider what kinds of exercise are best for you.  Keep all follow-up and prenatal visits as told by your health care provider. This is important. How is this prevented? To prevent preeclampsia or eclampsia from developing during another pregnancy:  Get proper medical care during pregnancy. Your health care provider may be able to prevent preeclampsia or diagnose and treat it early.  Your health care provider may have you take a low-dose aspirin or a calcium supplement during your next pregnancy.  You may have tests of your blood pressure and kidney function after giving birth.  Maintain a healthy weight. Ask your health care provider for help managing weight gain during pregnancy.  Work with your health care provider to manage any long-term (chronic) health conditions you have, such as diabetes or kidney problems. Contact a health care provider if:  You gain more weight than expected.  You have headaches.  You have nausea or vomiting.  You have abdominal pain.  You feel dizzy or light-headed. Get help right away if:  You develop sudden or severe swelling anywhere in your body. This usually happens in the legs.  You gain 5 lbs (2.3 kg) or more during one week.  You have severe:  Abdominal pain.  Headaches.  Dizziness.  Vision problems.  Confusion.  Nausea or vomiting.  You have a seizure.  You have trouble moving any part of your body.  You develop numbness in any part of your body.  You have trouble speaking.  You have any abnormal bleeding.  You pass out. This information is not intended to replace advice given to you by your health care provider. Make sure you discuss any questions you have with your health  care provider. Document Released: 02/05/2000 Document Revised: 10/06/2015 Document Reviewed: 09/14/2015 Elsevier Interactive Patient Education  2017 Else[employer] Fetal Movement Counts Patient Name: ________________________________________________ Patient Due Date: ____________________ What is a fetal movement count? A fetal movement count is the number of times that you feel your baby move during a certain amount of time. This may also be called a fetal kick count. A fetal movement count is recommended for every pregnant woman. You may be asked to start counting fetal movements as early as week 28 of your pregnancy. Pay attention to when your baby is most active. You may notice your baby's sleep and wake cycles. You may also notice things that make your baby move more. You should do a fetal movement count:  When your baby is normally most active.  At the same time each day. A good time to count movements is while you are resting, after having something to eat and drink. How do I count fetal movements? 1. Find a quiet, comfortable area. Sit, or lie down on your side. 2. Write down the date, the start time and stop time, and the number of movements that you felt between those two times. Take this information with you to your health care visits. 3. For 2 hours, count kicks, flutters,  swishes, rolls, and jabs. You should feel at least 10 movements during 2 hours. 4. You may stop counting after you have felt 10 movements. 5. If you do not feel 10 movements in 2 hours, have something to eat and drink. Then, keep resting and counting for 1 hour. If you feel at least 4 movements during that hour, you may stop counting. Contact a health care provider if:  You feel fewer than 4 movements in 2 hours.  Your baby is not moving like he or she usually does. Date: ____________ Start time: ____________ Stop time: ____________ Movements: ____________ Date: ____________ Start time: ____________ Stop time:  ____________ Movements: ____________ Date: ____________ Start time: ____________ Stop time: ____________ Movements: ____________ Date: ____________ Start time: ____________ Stop time: ____________ Movements: ____________ Date: ____________ Start time: ____________ Stop time: ____________ Movements: ____________ Date: ____________ Start time: ____________ Stop time: ____________ Movements: ____________ Date: ____________ Start time: ____________ Stop time: ____________ Movements: ____________ Date: ____________ Start time: ____________ Stop time: ____________ Movements: ____________ Date: ____________ Start time: ____________ Stop time: ____________ Movements: ____________ This information is not intended to replace advice given to you by your health care provider. Make sure you discuss any questions you have with your health care provider. Document Released: 03/09/2006 Document Revised: 10/07/2015 Document Reviewed: 03/19/2015 Elsevier Interactive Patient Education  2017 Elsevier Inc[employer] of the uterus can occur throughout pregnancy, but they are not always a sign that you are in labor. You may have practice contractions called Braxton Hicks contractions. These false labor contractions are sometimes confused with true labor. What are Deberah Pelton contractions? Braxton Hicks contractions are tightening movements that occur in the muscles of the uterus before labor. Unlike true labor contractions, these contractions do not result in opening (dilation) and thinning of the cervix. Toward the end of pregnancy (32-34 weeks), Braxton Hicks contractions can happen more often and may become stronger. These contractions are sometimes difficult to tell apart from true labor because they can be very uncomfortable. You should not feel embarrassed if you go to the hospital with false labor. Sometimes, the only way to tell if you are in true labor is for your health care  provider to look for changes in the cervix. The health care provider will do a physical exam and may monitor your contractions. If you are not in true labor, the exam should show that your cervix is not dilating and your water has not broken. If there are no prenatal problems or other health problems associated with your pregnancy, it is completely safe for you to be sent home with false labor. You may continue to have Braxton Hicks contractions until you go into true labor. How can I tell the difference between true labor and false labor?  Differences  False labor  Contractions last 30-70 seconds.: Contractions are usually shorter and not as strong as true labor contractions.  Contractions become very regular.: Contractions are usually irregular.  Discomfort is usually felt in the top of the uterus, and it spreads to the lower abdomen and low back.: Contractions are often felt in the front of the lower abdomen and in the groin.  Contractions do not go away with walking.: Contractions may go away when you walk around or change positions while lying down.  Contractions usually become more intense and increase in frequency.: Contractions get weaker and are shorter-lasting as time goes on.  The cervix dilates and gets thinner.: The cervix usually does not dilate or become thin. Follow  these instructions at home:  Take over-the-counter and prescription medicines only as told by your health care provider.  Keep up with your usual exercises and follow other instructions from your health care provider.  Eat and drink lightly if you think you are going into labor.  If Braxton Hicks contractions are making you uncomfortable:  Change your position from lying down or resting to walking, or change from walking to resting.  Sit and rest in a tub of warm water.  Drink enough fluid to keep your urine clear or pale yellow. Dehydration may cause these contractions.  Do slow and deep breathing  several times an hour.  Keep all follow-up prenatal visits as told by your health care provider. This is important. Contact a health care provider if:  You have a fever.  You have continuous pain in your abdomen. Get help right away if:  Your contractions become stronger, more regular, and closer together.  You have fluid leaking or gushing from your vagina.  You pass blood-tinged mucus (bloody show).  You have bleeding from your vagina.  You have low back pain that you never had before.  You feel your baby's head pushing down and causing pelvic pressure.  Your baby is not moving inside you as much as it used to. Summary  Contractions that occur before labor are called Braxton Hicks contractions, false labor, or practice contractions.  Braxton Hicks contractions are usually shorter, weaker, farther apart, and less regular than true labor contractions. True labor contractions usually become progressively stronger and regular and they become more frequent.  Manage discomfort from [facility] contractions by changing position, resting in a warm bath, drinking plenty of water, or practicing deep breathing. This information is not intended to replace advice given to you by your health care provider. Make sure you discuss any questions you have with your health care provider. Document Released: 02/07/2005 Document Revised: 12/28/2015 Document Reviewed: 12/28/2015 Elsevier Interactive Patient Education  2017 [employer].

## 2016-05-05 NOTE — Progress Notes (Signed)
   PRENATAL VISIT NOTE  Subjective:  Courtney Hodge is a 26 y.o. [id number] at 5359w5d being seen today for ongoing prenatal care.  She is currently monitored for the following issues for this low-risk pregnancy and has Supervision of low-risk pregnancy; Language barrier, cultural differences; Rh negative state in antepartum period; and Abnormal fetal ultrasound on her problem list.  Patient reports contractions since yesteday and blood-tinged mucus discharge yesterday. She states that contractions are painful, but she has not been timing them.   Contractions: Irritability. Vag. Bleeding: None.  Movement: Present. Denies leaking of fluid.   The following portions of the patient's history were reviewed and updated as appropriate: allergies, current medications, past family history, past medical history, past social history, past surgical history and problem list. Problem list updated.  Objective:   Vitals:   05/05/16 1036 05/05/16 1130  BP: (!) 141/82 (!) 147/87  Pulse: 82   Weight: 187 lb (84.8 kg)     Fetal Status: Fetal Heart Rate (bpm): 138 Fundal Height: 38 cm Movement: Present  Presentation: Vertex  General:  Alert, oriented and cooperative. Patient is in no acute distress.  Skin: Skin is warm and dry. No rash noted.   Cardiovascular: Normal heart rate noted  Respiratory: Normal respiratory effort, no problems with respiration noted  Abdomen: Soft, gravid, appropriate for gestational age. Pain/Pressure: Present   No tenderness to palpation.   Pelvic:  Cervical exam performed Dilation: 1 Effacement (%): 50 Station: -3  Extremities: Normal range of motion.  Edema: Trace  Mental Status: Normal mood and affect. Normal behavior. Normal judgment and thought content.   Assessment and Plan:  Pregnancy: G5P4004 at 2259w5d  1. Encounter for supervision of low-risk pregnancy in third trimester - Induction scheduled at 41 weeks if needed  2. Elevated blood pressure in pregnancy, third trimester -  Patient denies headache, visual changes, RUQ abdominal pain or LE edema - Sent to MAU for serial BPs, NST and Stat Labs today    Term labor symptoms and general obstetric precautions including but not limited to vaginal bleeding, contractions, leaking of fluid and fetal movement were reviewed in detail with the patient. Please refer to After Visit Summary for other counseling recommendations.  Return in about 1 week (around 05/12/2016) for LOB if discharged from MAU today .   Marny LowensteinJulie N Michaelene Dutan, PA-C

## 2016-05-06 ENCOUNTER — Encounter (HOSPITAL_COMMUNITY): Payer: Self-pay | Admitting: *Deleted

## 2016-05-06 DIAGNOSIS — O134 Gestational [pregnancy-induced] hypertension without significant proteinuria, complicating childbirth: Secondary | ICD-10-CM

## 2016-05-06 DIAGNOSIS — O9912 Other diseases of the blood and blood-forming organs and certain disorders involving the immune mechanism complicating childbirth: Secondary | ICD-10-CM

## 2016-05-06 DIAGNOSIS — D696 Thrombocytopenia, unspecified: Secondary | ICD-10-CM

## 2016-05-06 DIAGNOSIS — Z3A39 39 weeks gestation of pregnancy: Secondary | ICD-10-CM

## 2016-05-06 LAB — COMPREHENSIVE METABOLIC PANEL
ALT: 20 U/L (ref 14–54)
ANION GAP: 10 (ref 5–15)
AST: 26 U/L (ref 15–41)
Albumin: 2.9 g/dL — ABNORMAL LOW (ref 3.5–5.0)
Alkaline Phosphatase: 161 U/L — ABNORMAL HIGH (ref 38–126)
BILIRUBIN TOTAL: 0.5 mg/dL (ref 0.3–1.2)
BUN: 6 mg/dL (ref 6–20)
CALCIUM: 8.8 mg/dL — AB (ref 8.9–10.3)
CO2: 18 mmol/L — ABNORMAL LOW (ref 22–32)
Chloride: 109 mmol/L (ref 101–111)
Creatinine, Ser: 0.7 mg/dL (ref 0.44–1.00)
GLUCOSE: 94 mg/dL (ref 65–99)
POTASSIUM: 3.2 mmol/L — AB (ref 3.5–5.1)
Sodium: 137 mmol/L (ref 135–145)
TOTAL PROTEIN: 6.7 g/dL (ref 6.5–8.1)

## 2016-05-06 LAB — CBC
HEMATOCRIT: 34.8 % — AB (ref 36.0–46.0)
Hemoglobin: 12.5 g/dL (ref 12.0–15.0)
MCH: 31.1 pg (ref 26.0–34.0)
MCHC: 35.9 g/dL (ref 30.0–36.0)
MCV: 86.6 fL (ref 78.0–100.0)
Platelets: 122 10*3/uL — ABNORMAL LOW (ref 150–400)
RBC: 4.02 MIL/uL (ref 3.87–5.11)
RDW: 13.2 % (ref 11.5–15.5)
WBC: 7.2 10*3/uL (ref 4.0–10.5)

## 2016-05-06 LAB — RPR: RPR: NONREACTIVE

## 2016-05-06 MED ORDER — COCONUT OIL OIL: 1.0000 "application " | TOPICAL_OIL | Status: DC | PRN

## 2016-05-06 MED ORDER — OXYCODONE HCL 5 MG PO TABS
5.0000 mg | ORAL_TABLET | ORAL | Status: DC | PRN
Start: 2016-05-06 — End: 2016-05-08

## 2016-05-06 MED ORDER — FERROUS SULFATE 325 (65 FE) MG PO TABS
325.0000 mg | ORAL_TABLET | Freq: Two times a day (BID) | ORAL | Status: DC
  Administered 2016-05-06 – 2016-05-08 (×5): 325 mg via ORAL
  Filled 2016-05-06 (×5): qty 1

## 2016-05-06 MED ORDER — IBUPROFEN 600 MG PO TABS
600.0000 mg | ORAL_TABLET | Freq: Four times a day (QID) | ORAL | Status: DC
  Administered 2016-05-06 – 2016-05-08 (×8): 600 mg via ORAL
  Filled 2016-05-06 (×8): qty 1

## 2016-05-06 MED ORDER — ONDANSETRON HCL 4 MG/2ML IJ SOLN: 4.0000 mg | INTRAMUSCULAR | Status: DC | PRN

## 2016-05-06 MED ORDER — ZOLPIDEM TARTRATE 5 MG PO TABS
5.0000 mg | ORAL_TABLET | Freq: Every evening | ORAL | Status: DC | PRN
Start: 2016-05-06 — End: 2016-05-08

## 2016-05-06 MED ORDER — METHYLERGONOVINE MALEATE 0.2 MG/ML IJ SOLN: 0.2000 mg | INTRAMUSCULAR | Status: DC | PRN

## 2016-05-06 MED ORDER — WITCH HAZEL-GLYCERIN EX PADS: 1.0000 "application " | MEDICATED_PAD | CUTANEOUS | Status: DC | PRN

## 2016-05-06 MED ORDER — BISACODYL 10 MG RE SUPP: 10.0000 mg | Freq: Every day | RECTAL | Status: DC | PRN

## 2016-05-06 MED ORDER — PRENATAL MULTIVITAMIN CH
1.0000 | ORAL_TABLET | Freq: Every day | ORAL | Status: DC
  Administered 2016-05-06 – 2016-05-08 (×3): 1 via ORAL
  Filled 2016-05-06 (×3): qty 1

## 2016-05-06 MED ORDER — DIPHENHYDRAMINE HCL 25 MG PO CAPS: 25.0000 mg | ORAL_CAPSULE | Freq: Four times a day (QID) | ORAL | Status: DC | PRN

## 2016-05-06 MED ORDER — TETANUS-DIPHTH-ACELL PERTUSSIS 5-2.5-18.5 LF-MCG/0.5 IM SUSP: 0.5000 mL | Freq: Once | INTRAMUSCULAR | Status: DC

## 2016-05-06 MED ORDER — ONDANSETRON HCL 4 MG PO TABS
4.0000 mg | ORAL_TABLET | ORAL | Status: DC | PRN
Start: 2016-05-06 — End: 2016-05-08

## 2016-05-06 MED ORDER — FLEET ENEMA 7-19 GM/118ML RE ENEM: 1.0000 | ENEMA | Freq: Every day | RECTAL | Status: DC | PRN

## 2016-05-06 MED ORDER — ACETAMINOPHEN 325 MG PO TABS: 650.0000 mg | ORAL_TABLET | ORAL | Status: DC | PRN

## 2016-05-06 MED ORDER — SIMETHICONE 80 MG PO CHEW: 80.0000 mg | CHEWABLE_TABLET | ORAL | Status: DC | PRN

## 2016-05-06 MED ORDER — METHYLERGONOVINE MALEATE 0.2 MG PO TABS: 0.2000 mg | ORAL_TABLET | ORAL | Status: DC | PRN

## 2016-05-06 MED ORDER — DIBUCAINE 1 % RE OINT: 1.0000 "application " | TOPICAL_OINTMENT | RECTAL | Status: DC | PRN

## 2016-05-06 MED ORDER — MEASLES, MUMPS & RUBELLA VAC ~~LOC~~ INJ: 0.5000 mL | INJECTION | Freq: Once | SUBCUTANEOUS | Status: DC

## 2016-05-06 MED ORDER — DOCUSATE SODIUM 100 MG PO CAPS
100.0000 mg | ORAL_CAPSULE | Freq: Two times a day (BID) | ORAL | Status: DC
  Administered 2016-05-07 – 2016-05-08 (×4): 100 mg via ORAL
  Filled 2016-05-06 (×4): qty 1

## 2016-05-06 MED ORDER — BENZOCAINE-MENTHOL 20-0.5 % EX AERO: 1.0000 "application " | INHALATION_SPRAY | CUTANEOUS | Status: DC | PRN

## 2016-05-06 MED ORDER — OXYCODONE HCL 5 MG PO TABS: 10.0000 mg | ORAL_TABLET | ORAL | Status: DC | PRN

## 2016-05-06 NOTE — Progress Notes (Signed)
Foley fell out. Cx 5 cms.  FHR Cat 1.  Will start pitocin

## 2016-05-06 NOTE — Lactation Note (Signed)
This note was copied from a baby's chart. Lactation Consultation Note  Patient Name: Courtney Hodge ZOXWR'UToday's Date: 05/06/2016 Reason for consult: Initial assessment   With this experienced breast feeding mom and her term baby, now 328 hours old. Mom has BF the baby twice so far. He began rooting while I was in the room, and she easily latched him in cradle hold, deeply with strong suckles and visible swallows. I used the video Dance movement psychotherapistwahilli  Interpreter for the consult, [place], # [id number]. I did basic breast feeding teaching with mom, as well as lactation services. Mom said she would not mind being checked on daily. I showed her how to call for a nurse, if she has concerns or questions.    Maternal Data Formula Feeding for Exclusion: No Has patient been taught Hand Expression?: Yes Does the patient have breastfeeding experience prior to this delivery?: Yes  Feeding Feeding Type: Breast Fed Length of feed: 20 min  LATCH Score/Interventions Latch: Grasps breast easily, tongue down, lips flanged, rhythmical sucking.  Audible Swallowing: Spontaneous and intermittent  Type of Nipple: Everted at rest and after stimulation  Comfort (Breast/Nipple): Soft / non-tender     Hold (Positioning): No assistance needed to correctly position infant at breast. Intervention(s): Breastfeeding basics reviewed;Support Pillows;Position options;Skin to skin  LATCH Score: 10  Lactation Tools Discussed/Used     Consult Status Consult Status: Follow-up Date: 05/07/16 Follow-up type: In-patient    Alfred LevinsLee, Taylor Spilde Anne 05/06/2016, 4:31 PM

## 2016-05-06 NOTE — Progress Notes (Signed)
Using interpreter [phone]#[id number], pt denies dizziness.  Declines RN to order food for lunch, reporting that her visitors brought her food.  Instructed pt to call if she needed to get up to go to the bathroom.

## 2016-05-06 NOTE — Progress Notes (Signed)
Pt reports no pain.  Pt reports that she was helped to the bathroom earlier and did not feel dizzy.  Encouraged pt to call another time prior to getting up.  Pt verbalized understanding.  Dinner ordered for pt.  Interpreter [phone]#[id number] used.

## 2016-05-06 NOTE — Progress Notes (Signed)
Pt continues to push. CNM aware and at bedside. Pt does what she feels her body needs

## 2016-05-06 NOTE — Progress Notes (Signed)
Pt puushing with UCs

## 2016-05-06 NOTE — Progress Notes (Signed)
While using interpreter [phone]#[id number], Pt verbalized her desire to use her mother to interpret instead of the phone/ipad interpreters.

## 2016-05-06 NOTE — Plan of Care (Signed)
Problem: Activity: Goal: Ability to tolerate increased activity will improve Outcome: Not Progressing Pt gotten up to the bathroom by [place]ara NT and [place] NT at approximately 1115am.  Pt got up with ease but while on the toilet, became dizzy and lightheaded.  The emergency cord was pulled, multiple RNs rushed in, Ammonia used and pt gotten back to the bed with a steady.  The pt did not fall.  Pt VS when she was back in bed were 144/74, HR 72, Temp 98.7 and SpO2 99% on room air.  Pt fundus U/E and scant bleeding noted on assessment.  Pt instructed to call out if she needs to use the bathroom again. Pt verbalized understanding.

## 2016-05-06 NOTE — Progress Notes (Signed)
Admission education, baby safety, fall safety, and introduction to unit done using interpreter #[id number].

## 2016-05-07 MED ORDER — RHO D IMMUNE GLOBULIN 1500 UNIT/2ML IJ SOSY
300.0000 ug | PREFILLED_SYRINGE | Freq: Once | INTRAMUSCULAR | Status: AC
  Administered 2016-05-07: 300 ug via INTRAVENOUS
  Filled 2016-05-07: qty 2

## 2016-05-07 NOTE — Lactation Note (Addendum)
This note was copied from a baby's chart. Lactation Consultation Note  Kennyth Loseacifica Interpreter [phone]#[id number]. P5, BF all her chiildren for  2 years.  Baby 33 hours old and in nursery for bath. Reviewed supply and demand and mother states she is giving formula because it was given to her.  Has never given formula to her other children and states " that the formula was given to her and in country when something is given to you, you don't refuse". Encouraged mother to continue breastfeeding.  She stated "it was her choice to breastfeed". Mom encouraged to feed baby 8-12 times/24 hours and with feeding cues.  Bf before offering formula. Reviewed hand expression and how to use hand pump.  [facility] consult [facility] and asked with limited english where her baby was.  She stated that her baby had left her room for the nursery at 3:15p and has not returned.  LC went to nursery. Spoke with charge nurse Misty and Museum/gallery curatorMichelle RN and asked if baby could return to the room.  Misty RN stated yes so LC returned to mother's room.  Mother very happy her baby was returned.  Baby latched in cradle hold. Spoke with Melanie NT who stated she had given mother a choice to bath baby in the room or the nursery and mother stated she wanted baby bathed in the nursery.        Patient Name: Courtney Ermalinda BarriosBea Hodge EAVWU'JToday's Date: 05/07/2016 Reason for consult: Follow-up assessment   Maternal Data    Feeding Feeding Type: Breast Fed Length of feed: 30 min  LATCH Score/Interventions                      Lactation Tools Discussed/Used     Consult Status Consult Status: Follow-up Date: 05/08/16 Follow-up type: In-patient    Dahlia ByesBerkelhammer, [name] 05/07/2016, 4:23 PM

## 2016-05-07 NOTE — Plan of Care (Signed)
Problem: Education: Goal: Knowledge of condition will improve Use Swahili interpreter services

## 2016-05-07 NOTE — Progress Notes (Signed)
POSTPARTUM PROGRESS NOTE  Post Partum Day 1 Subjective:  Courtney Hodge is a 26 y.o. [id number] 3433w6d s/p SVD.  No acute events overnight.  Pt denies problems with ambulating, voiding or po intake.  She denies nausea or vomiting.  Pain is well controlled.  She has had flatus. She has not had bowel movement.  Lochia Small.   Objective: Blood pressure 124/76, pulse 71, temperature 98.3 F (36.8 C), temperature source Oral, resp. rate 18, height 5\' 4"  (1.626 m), weight 187 lb (84.8 kg), last menstrual period 08/01/2015, SpO2 99 %, unknown if currently breastfeeding.  Physical Exam:  General: alert, cooperative and no distress Lochia:normal flow Chest: no respiratory distress Heart:regular rate, distal pulses intact Abdomen: soft, nontender,  Uterine Fundus: firm, appropriately tender DVT Evaluation: No calf swelling or tenderness Extremities: +1 edema   Recent Labs  05/05/16 1232 05/06/16 0046  HGB 12.8 12.5  HCT 35.2* 34.8*    Assessment/Plan:  ASSESSMENT: Courtney Hodge is a 26 y.o. [id number] 9033w6d s/p SVD  Plan for discharge tomorrow and Breastfeeding   LOS: 2 days   Les Pouicholas SchenkMD 05/07/2016, 4:52 PM

## 2016-05-08 LAB — RH IG WORKUP (INCLUDES ABO/RH)
ABO/RH(D): O NEG
FETAL SCREEN: NEGATIVE
Gestational Age(Wks): 39.6
UNIT DIVISION: 0

## 2016-05-08 MED ORDER — IBUPROFEN 600 MG PO TABS: 600.0000 mg | ORAL_TABLET | Freq: Four times a day (QID) | ORAL | 0 refills | Status: DC

## 2016-05-08 NOTE — Discharge Instructions (Signed)

## 2016-05-08 NOTE — Discharge Summary (Signed)
OB Discharge Summary  Patient Name: Courtney Hodge DOB: 1990-04-01 MRN: [id number]  Date of admission: 05/05/2016 Delivering MD: Jacklyn Shell   Date of discharge: 05/08/2016  Admitting diagnosis: 39WKS, BP Intrauterine pregnancy: [redacted]w[redacted]d     Secondary diagnosis:Active Problems:   * No active hospital problems. *  Additional problems:none     Discharge diagnosis: Term Pregnancy Delivered                                                                     Post partum procedures:n/a  Augmentation: Pitocin  Complications: None  Hospital course:  Onset of Labor With Vaginal Delivery     26 y.o. yo G5P5005 at [redacted]w[redacted]d was admitted in Active Labor on 05/05/2016. Patient had an uncomplicated labor course as follows:  Membrane Rupture Time/Date: 5:48 AM ,05/06/2016   Intrapartum Procedures: Episiotomy: None [1]                                         Lacerations:  None [1]  Patient had a delivery of a Viable infant. 05/06/2016  Information for the patient's newborn:  Courtney, Hodge Girl Courtney Hodge [[id number]]  Delivery Method: Vaginal, Spontaneous Delivery (Filed from Delivery Summary)    Pateint had an uncomplicated postpartum course.  She is ambulating, tolerating a regular diet, passing flatus, and urinating well. Patient is discharged home in stable condition on 05/08/16.   Physical exam  Vitals:   05/06/16 2145 05/07/16 0536 05/07/16 1800 05/08/16 0623  BP: 122/74 124/76 (!) 143/74 135/82  Pulse: 68 71 (!) 59 69  Resp: 18 18 18 20   Temp: 98.7 F (37.1 C) 98.3 F (36.8 C) 98.4 F (36.9 C) 98.2 F (36.8 C)  TempSrc: Oral Oral  Oral  SpO2: 99%  98%   Weight:      Height:       General: alert, cooperative and no distress Lochia: appropriate Uterine Fundus: firm Incision: N/A DVT Evaluation: No evidence of DVT seen on physical exam. Labs: Lab Results  Component Value Date   WBC 7.2 05/06/2016   HGB 12.5 05/06/2016   HCT 34.8 (L) 05/06/2016   MCV 86.6 05/06/2016   PLT  122 (L) 05/06/2016   CMP Latest Ref Rng & Units 05/06/2016  Glucose 65 - 99 mg/dL 94  BUN 6 - 20 mg/dL 6  Creatinine 7.82 - 9.56 mg/dL 2.13  Sodium 086 - 578 mmol/L 137  Potassium 3.5 - 5.1 mmol/L 3.2(L)  Chloride 101 - 111 mmol/L 109  CO2 22 - 32 mmol/L 18(L)  Calcium 8.9 - 10.3 mg/dL 4.6(N)  Total Protein 6.5 - 8.1 g/dL 6.7  Total Bilirubin 0.3 - 1.2 mg/dL 0.5  Alkaline Phos 38 - 126 U/L 161(H)  AST 15 - 41 U/L 26  ALT 14 - 54 U/L 20    Discharge instruction: per After Visit Summary and "Baby and Me Booklet".  After Visit Meds:  Allergies as of 05/08/2016   No Known Allergies     Medication List    TAKE these medications   ibuprofen 600 MG tablet Commonly known as:  ADVIL,MOTRIN Take 1 tablet (600 mg total) by mouth  every 6 (six) hours.   Prenatal Vitamins 0.8 MG tablet Take 1 tablet by mouth daily.   terconazole 0.4 % vaginal cream Commonly known as:  TERAZOL 7 Place 1 applicator vaginally at bedtime.       Diet: routine diet  Activity: Advance as tolerated. Pelvic rest for 6 weeks.   Outpatient follow up:6 weeks Follow up Appt:Future Appointments Date Time Provider Department Center  05/16/2016 7:30 AM WH-BSSCHED ROOM WH-BSSCHED None   Follow up visit: GYN clinic for post partum appointment Postpartum contraception: Nexplanon  Newborn Data: Live born female  Birth Weight: 7 lb 11.8 oz (3510 g) APGAR: 8, 9  Baby Feeding: Breast Disposition:home with mother   05/08/2016 Courtney DuskyMarie Hodge, CNM

## 2016-05-08 NOTE — Lactation Note (Signed)
This note was copied from a baby's chart. Lactation Consultation Note  Video interpreter used. Mom encouraged to feed baby 8-12 times/24 hours and with feeding cues.  Mother denies questions or concerns regarding breastfeeding. Reviewed engorgement care and monitoring voids/stools. Mother had questions for OB/GYN.  Referred questions to RN and OB.   Patient Name: Girl Ermalinda BarriosBea Sia NFAOZ'HToday's Date: 05/08/2016 Reason for consult: Follow-up assessment   Maternal Data    Feeding    LATCH Score/Interventions                      Lactation Tools Discussed/Used     Consult Status Consult Status: Complete    Hardie PulleyBerkelhammer, Mikaella Escalona Boschen 05/08/2016, 10:23 AM

## 2016-05-08 NOTE — Progress Notes (Signed)
Zerita Boersarlene Lawson, CNM here to discuss back to work and birth control options with mom.  AVS changed to reflect change in plan.  Patient to [facility] and nexplanon implant.  Discussed with patient via interpreter service [phone]

## 2016-05-08 NOTE — Discharge Summary (Signed)
OB Discharge Summary  Patient Name: Courtney BarriosBea Metivier DOB: 03/21/1990 MRN: [id number]  Date of admission: 05/05/2016 Delivering MD: Jacklyn ShellRESENZO-DISHMON, FRANCES   Date of discharge: 05/08/2016  Admitting diagnosis: 39WKS, BP Intrauterine pregnancy: 303w6d     Secondary diagnosis:Active Problems:   * No active hospital problems. *      Discharge diagnosis: CHTN, s/p IOL                                                                 Post partum procedures:none  Augmentation: Pitocin, Cytotec and Foley Balloon  Complications: None  Hospital course:  Induction of Labor With Vaginal Delivery   26 y.o. yo G5P5005 at 893w6d was admitted to the hospital 05/05/2016 for induction of labor.  Indication for induction: Gestational hypertension.  Patient had an uncomplicated labor course as follows: Membrane Rupture Time/Date: 5:48 AM ,05/06/2016   Intrapartum Procedures: Episiotomy: None [1]                                         Lacerations:  None [1]  Patient had delivery of a Viable infant.  Information for the patient's newborn:  Flonnie HailstoneZabibu, Girl Harlan StainsBea [[id number]][[id number]]  Delivery Method: Vaginal, Spontaneous Delivery (Filed from Delivery Summary)   05/06/2016  Details of delivery can be found in separate delivery note.  Patient had a routine postpartum course. Patient is discharged home 05/08/16.  Physical exam  Vitals:   05/06/16 2145 05/07/16 0536 05/07/16 1800 05/08/16 0623  BP: 122/74 124/76 (!) 143/74 135/82  Pulse: 68 71 (!) 59 69  Resp: 18 18 18 20   Temp: 98.7 F (37.1 C) 98.3 F (36.8 C) 98.4 F (36.9 C) 98.2 F (36.8 C)  TempSrc: Oral Oral  Oral  SpO2: 99%  98%   Weight:      Height:       General: alert Lochia: appropriate Uterine Fundus: firm Incision: N/A DVT Evaluation: No evidence of DVT seen on physical exam. Labs: Lab Results  Component Value Date   WBC 7.2 05/06/2016   HGB 12.5 05/06/2016   HCT 34.8 (L) 05/06/2016   MCV 86.6 05/06/2016   PLT 122 (L) 05/06/2016    CMP Latest Ref Rng & Units 05/06/2016  Glucose 65 - 99 mg/dL 94  BUN 6 - 20 mg/dL 6  Creatinine 7.820.44 - 9.561.00 mg/dL 2.130.70  Sodium 086135 - 578145 mmol/L 137  Potassium 3.5 - 5.1 mmol/L 3.2(L)  Chloride 101 - 111 mmol/L 109  CO2 22 - 32 mmol/L 18(L)  Calcium 8.9 - 10.3 mg/dL 4.6(N8.8(L)  Total Protein 6.5 - 8.1 g/dL 6.7  Total Bilirubin 0.3 - 1.2 mg/dL 0.5  Alkaline Phos 38 - 126 U/L 161(H)  AST 15 - 41 U/L 26  ALT 14 - 54 U/L 20    Discharge instruction: per After Visit Summary and "Baby and Me Booklet".  After Visit Meds:  Allergies as of 05/08/2016   No Known Allergies     Medication List    TAKE these medications   ibuprofen 600 MG tablet Commonly known as:  ADVIL,MOTRIN Take 1 tablet (600 mg total) by mouth every 6 (six) hours.   Prenatal Vitamins  0.8 MG tablet Take 1 tablet by mouth daily.   terconazole 0.4 % vaginal cream Commonly known as:  TERAZOL 7 Place 1 applicator vaginally at bedtime.       Diet: routine diet  Activity: Advance as tolerated. Pelvic rest for 6 weeks.   Outpatient follow up:6 weeks Follow up Appt:Future Appointments Date Time Provider Department Center  05/16/2016 7:30 AM WH-BSSCHED ROOM WH-BSSCHED None   Follow up visit: No Follow-up on file.  Postpartum contraception: Nexplanon at health dept  Newborn Data: Live born female  Birth Weight: 7 lb 11.8 oz (3510 g) APGAR: 8, 9  Baby Feeding: Breast Disposition:home with mother   05/08/2016 Allie Bossier, MD

## 2016-05-09 LAB — TYPE AND SCREEN
ABO/RH(D): O NEG
Antibody Screen: POSITIVE
DAT, IgG: NEGATIVE
Unit division: 0
Unit division: 0

## 2016-05-09 LAB — BPAM RBC
Blood Product Expiration Date: 201804192359
Blood Product Expiration Date: 201804192359
Unit Type and Rh: 9500
Unit Type and Rh: 9500

## 2016-05-10 ENCOUNTER — Encounter: Payer: Self-pay | Admitting: General Practice

## 2016-05-16 ENCOUNTER — Inpatient Hospital Stay (HOSPITAL_COMMUNITY): Admission: RE | Admit: 2016-05-16 | Payer: Medicaid Other | Source: Ambulatory Visit

## 2016-06-15 ENCOUNTER — Ambulatory Visit: Payer: Self-pay | Admitting: Obstetrics and Gynecology

## 2016-06-16 ENCOUNTER — Telehealth: Payer: Self-pay | Admitting: General Practice

## 2016-06-16 ENCOUNTER — Ambulatory Visit: Payer: Self-pay | Admitting: Advanced Practice Midwife

## 2016-06-16 NOTE — Telephone Encounter (Signed)
Patient no showed for pp visit. Called patient with pacific interpreter [phone]. Patient states she missed her appt because she had another appt somewhere but has rescheduled her appt for tomorrow. Patient had no questions

## 2016-06-17 ENCOUNTER — Encounter: Payer: Self-pay | Admitting: Obstetrics & Gynecology

## 2016-06-17 ENCOUNTER — Ambulatory Visit (INDEPENDENT_AMBULATORY_CARE_PROVIDER_SITE_OTHER): Payer: BLUE CROSS/BLUE SHIELD | Admitting: Obstetrics & Gynecology

## 2016-06-17 DIAGNOSIS — Z3042 Encounter for surveillance of injectable contraceptive: Secondary | ICD-10-CM | POA: Diagnosis not present

## 2016-06-17 DIAGNOSIS — Z30013 Encounter for initial prescription of injectable contraceptive: Secondary | ICD-10-CM | POA: Diagnosis not present

## 2016-06-17 LAB — POCT PREGNANCY, URINE: Preg Test, Ur: NEGATIVE

## 2016-06-17 MED ORDER — MEDROXYPROGESTERONE ACETATE 150 MG/ML IM SUSP
150.0000 mg | INTRAMUSCULAR | Status: DC
  Administered 2016-06-17 – 2017-04-21 (×5): 150 mg via INTRAMUSCULAR

## 2016-06-17 NOTE — Progress Notes (Addendum)
Subjective:     Courtney Hodge is a 26 y.o. A2Z3086 female who presents for a postpartum visit. Due to language barrier, a Swahili video interpreter was present during the history-taking and subsequent discussion (and for part of the physical exam) with this patient.  She is six weeks postpartum following a delivery on 05/06/2016. I have fully reviewed the prenatal and intrapartum course. The delivery was at 39 gestational weeks. Outcome: vaginal. Anesthesia: none. Postpartum course has been uncomplicated. Baby's course has been uncomplicated. Baby is feeding by breast . Bleeding yes. Bowel function is normal. Bladder function is normal. Patient is sexually active. Contraception method is condoms; desires Depo Provera. Postpartum depression screening: negative  Review of Systems Pertinent items noted in HPI and remainder of comprehensive ROS otherwise negative.  Normal pap 02/02/16.  Objective:   BP 121/78   Pulse 74   Wt 175 lb 4.8 oz (79.5 kg)   BMI 30.09 kg/m   General:  alert and no distress   Breasts:  inspection negative, no nipple discharge or bleeding, no masses or nodularity palpable  Lungs: clear to auscultation bilaterally  Heart:  regular rate and rhythm  Abdomen: soft, non-tender; bowel sounds normal; no masses,  no organomegaly   Pelvic:  not evaluated        Assessment:   Normal postpartum exam.  Depo Provera adminstration  Plan:   1. Contraception: Depo-Provera injections 2.  Follow up in: 3 months for next injection or as needed.     Jaynie Collins, MD, FACOG Attending Obstetrician & Gynecologist, [facility] for [employer], [facility]

## 2016-06-17 NOTE — Patient Instructions (Signed)

## 2016-09-02 ENCOUNTER — Ambulatory Visit (INDEPENDENT_AMBULATORY_CARE_PROVIDER_SITE_OTHER): Payer: BLUE CROSS/BLUE SHIELD | Admitting: *Deleted

## 2016-09-02 VITALS — BP 126/84 | HR 71 | Wt 187.1 lb

## 2016-09-02 DIAGNOSIS — Z3042 Encounter for surveillance of injectable contraceptive: Secondary | ICD-10-CM | POA: Diagnosis not present

## 2016-09-02 NOTE — Progress Notes (Signed)
Pt expressed concerns due to having very light periods since the start of Depo Provera. I explained this is normal and that she is still protected from unplanned pregnancy. Pt voiced understanding. Depo Provera 150 mg IM administered as scheduled. Pt tolerated well. Next injection due 9/28-10/12.  Pt also had questions regarding a bill she received. Per review, the bill was for her child. Pt advised to call the accounting dept to discuss. Video interpreter Greggory StallionGeorge [phone]#[id number] used for encounter.

## 2016-11-18 ENCOUNTER — Ambulatory Visit (INDEPENDENT_AMBULATORY_CARE_PROVIDER_SITE_OTHER): Payer: Medicaid Other | Admitting: General Practice

## 2016-11-18 DIAGNOSIS — Z3042 Encounter for surveillance of injectable contraceptive: Secondary | ICD-10-CM | POA: Diagnosis not present

## 2016-11-18 NOTE — Progress Notes (Signed)
Patient given information for [facility] to establish care for BP.

## 2016-11-18 NOTE — Progress Notes (Signed)
Agree with nursing staff's documentation of this patient's clinic encounter.  Ugonna Anyanwu, MD    

## 2017-02-03 ENCOUNTER — Ambulatory Visit (INDEPENDENT_AMBULATORY_CARE_PROVIDER_SITE_OTHER): Payer: Medicaid Other | Admitting: *Deleted

## 2017-02-03 VITALS — BP 128/93 | HR 69

## 2017-02-03 DIAGNOSIS — Z3049 Encounter for surveillance of other contraceptives: Secondary | ICD-10-CM

## 2017-02-03 DIAGNOSIS — Z3042 Encounter for surveillance of injectable contraceptive: Secondary | ICD-10-CM

## 2017-02-03 NOTE — Progress Notes (Signed)
Patient presents to clinic for depoprovera injection. Tolerated well. Swahili interpreter used for visit.

## 2017-04-21 ENCOUNTER — Ambulatory Visit (INDEPENDENT_AMBULATORY_CARE_PROVIDER_SITE_OTHER): Payer: BLUE CROSS/BLUE SHIELD | Admitting: General Practice

## 2017-04-21 VITALS — BP 128/91 | HR 77 | Ht 65.0 in | Wt 193.0 lb

## 2017-04-21 DIAGNOSIS — Z3042 Encounter for surveillance of injectable contraceptive: Secondary | ICD-10-CM | POA: Diagnosis not present

## 2017-04-21 NOTE — Progress Notes (Signed)
Courtney Hodge here for Depo-Provera  Injection.  Injection administered without complication. Patient will return in 3 months for next injection.  Marylynn PearsonCarrie Hillman, RN 04/21/2017  9:37 AM

## 2017-04-24 NOTE — Progress Notes (Signed)
I have reviewed this chart and agree with the RN/CMA assessment and management.    Kaysa Roulhac C Breylon Sherrow, MD, FACOG Attending Physician, [facility]  

## 2017-07-07 ENCOUNTER — Ambulatory Visit: Payer: Self-pay

## 2018-07-16 IMAGING — US US MFM OB FOLLOW-UP
1 series · 14 of 28 positions shown · non-contrast
Comparison: none

[Series 1: us mfm ob follow-up · 50 acquisitions, 14 frames shown]
[im 2/50]
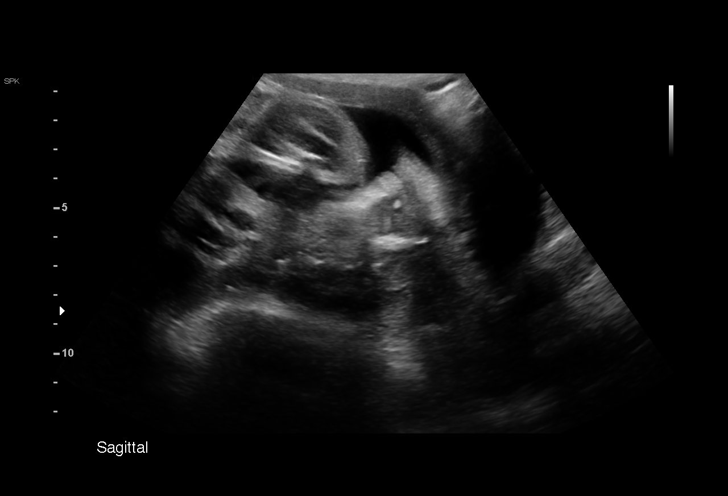
[im 6/50]
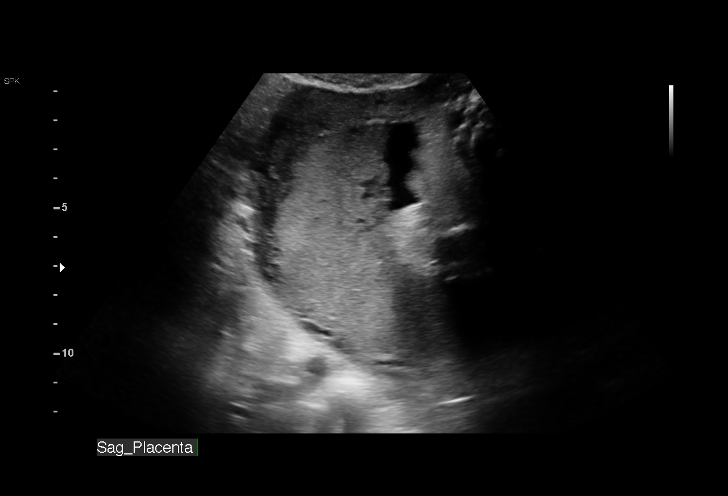
[im 10/50]
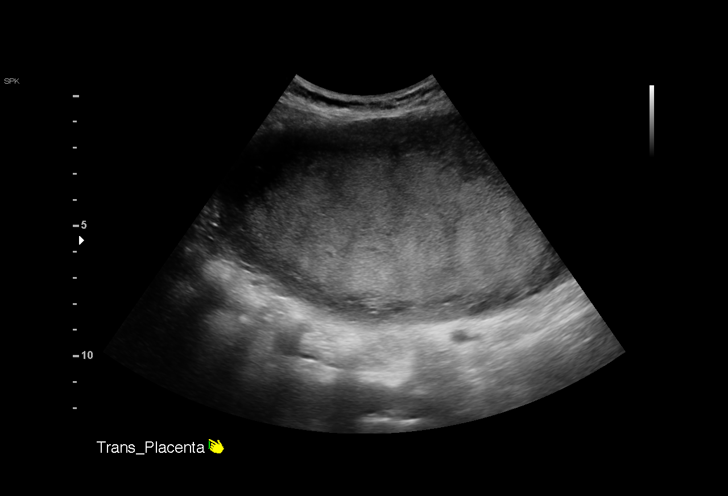
[im 13/50]
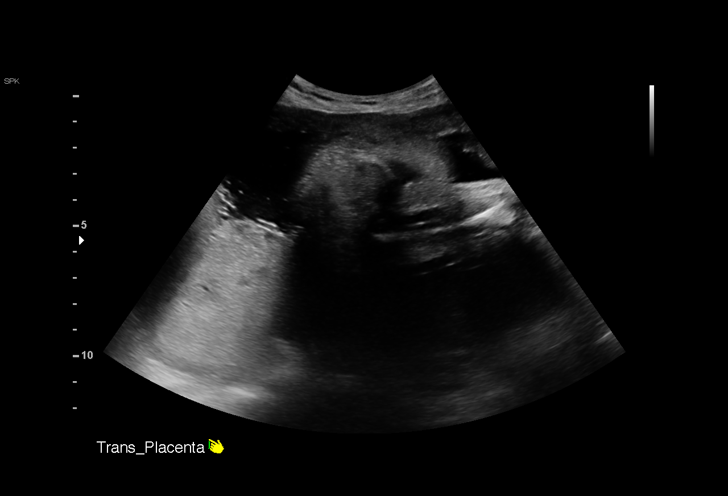
[im 17/50]
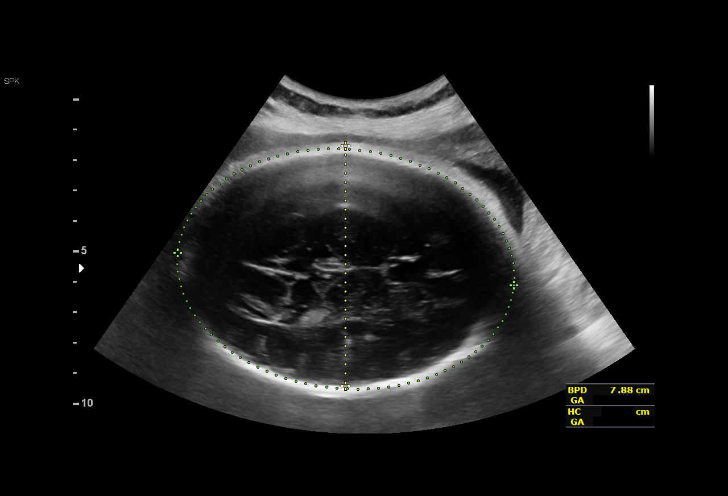
[im 20/50]
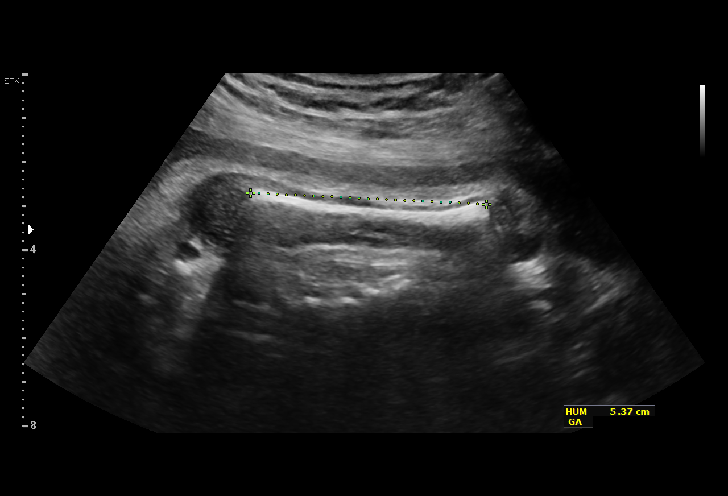
[im 24/50]
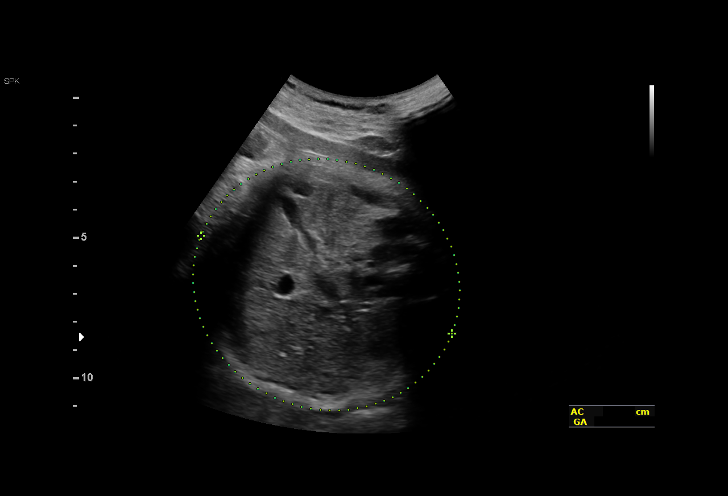
[im 28/50]
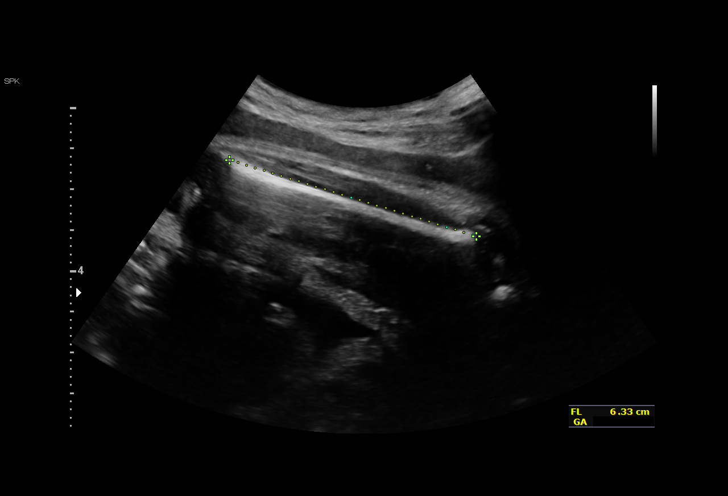
[im 31/50]
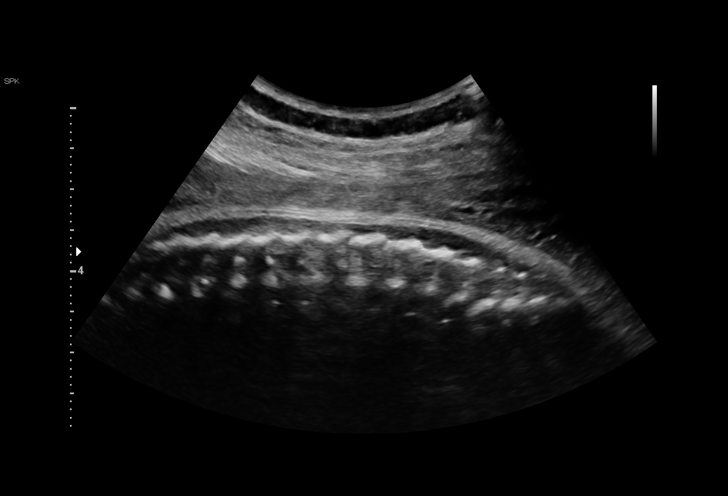
[im 35/50]
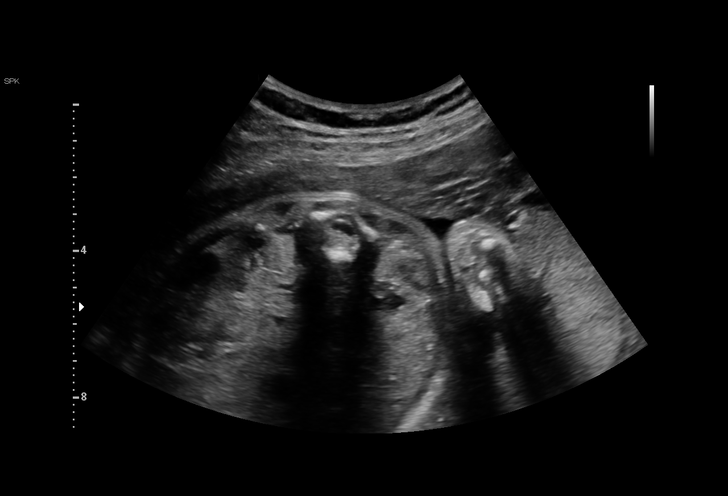
[im 39/50]
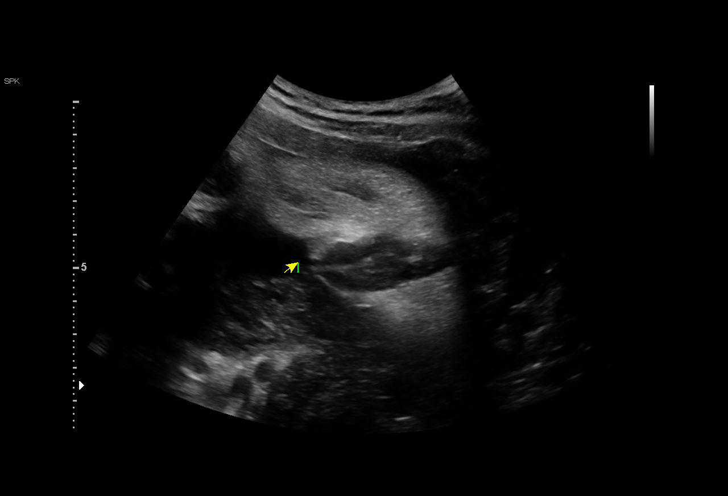
[im 42/50]
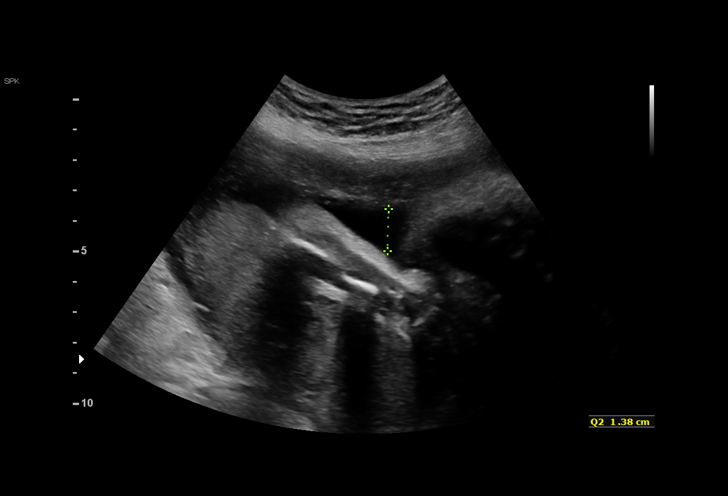
[im 46/50]
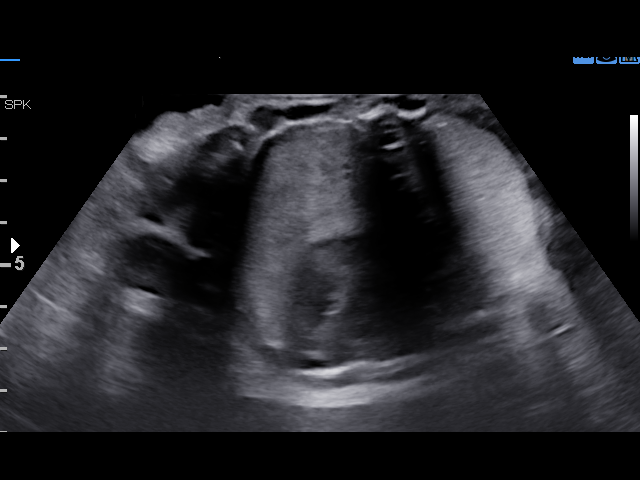
[im 50/50]
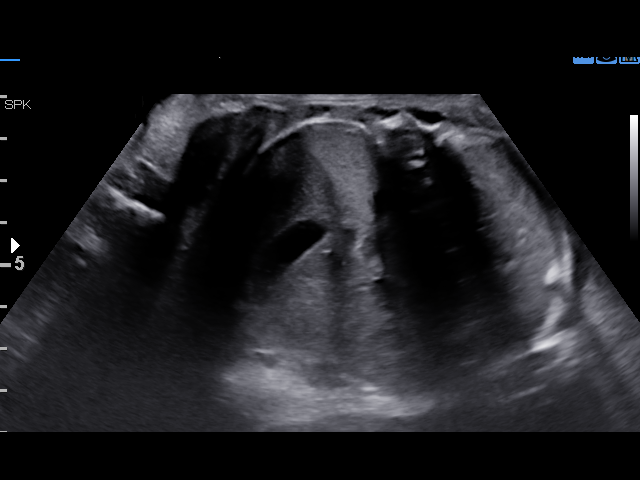

[14 of 28 positions shown; findings below may reference images not displayed]

[REDACTED]

1  MRATO INO            [id number]      [id number]     [id number]
Indications

31 weeks gestation of pregnancy
Size-Date Discrepancy
Encounter for other antenatal screening
follow-up
OB History

Blood Type:            Height:  5'4"   Weight (lb):  144      BMI:
Gravidity:    5         Term:   4        Prem:   0        SAB:   0
TOP:          0       Ectopic:  0        Living: 4
Fetal Evaluation

Num Of Fetuses:     1
Fetal Heart         144
Rate(bpm):
Cardiac Activity:   Observed
Presentation:       Breech
Placenta:           Fundal, above cervical os

Amniotic Fluid
AFI FV:      Subjectively within normal limits

AFI Sum(cm)     %Tile       Largest Pocket(cm)
9.99            15

RUQ(cm)       RLQ(cm)       LUQ(cm)        LLQ(cm)
3.45
Biometry
BPD:      79.2  mm     G. Age:  31w 5d         51  %    CI:        69.99   %   70 - 86
FL/HC:      20.9   %   19.3 -
HC:       302   mm     G. Age:  33w 4d         71  %    HC/AC:      1.06       0.96 -
AC:      283.8  mm     G. Age:  32w 3d         76  %    FL/BPD:     79.8   %   71 - 87
FL:       63.2  mm     G. Age:  32w 5d         71  %    FL/AC:      22.3   %   20 - 24
HUM:      53.9  mm     G. Age:  31w 2d         50  %

Est. FW:    4229  gm      4 lb 6 oz     76  %
Gestational Age

LMP:           31w 3d       Date:   08/01/15                 EDD:   05/07/16
U/S Today:     32w 4d                                        EDD:   04/29/16
Best:          31w 3d    Det. By:   LMP  (08/01/15)          EDD:   05/07/16
Anatomy

Cranium:               Appears normal         Aortic Arch:            Previously seen
Cavum:                 Appears normal         Ductal Arch:            Previously seen
Ventricles:            Appears normal         Diaphragm:              Appears normal
Choroid Plexus:        Previously seen        Stomach:                Appears normal, left
sided
Cerebellum:            Previously seen        Abdomen:                Appears normal
Posterior Fossa:       Previously seen        Abdominal Wall:         Previously seen
Nuchal Fold:           Not applicable (>20    Cord Vessels:           Appears normal (3
wks GA)                                        vessel cord)
Face:                  Orbits and profile     Kidneys:                Appear normal
previously seen
Lips:                  Previously seen        Bladder:                Appears normal
Thoracic:              Appears normal         Spine:                  Ltd views no
intracranial signs of
NTD
Heart:                 Appears normal         Upper Extremities:      Previously seen
(4CH, axis, and situs
RVOT:                  Previously seen        Lower Extremities:      Previously seen
LVOT:                  Previously seen

Other:  Gender appears to be female Both heels seen,
Impression

Singleton intrauterine pregnancy at 31+3 weeks. Previous
scan suspicious for ambiguous genitalia
Review of the anatomy shows no sonographic markers for
aneuploidy or structural anomalies
Good views of normal female genitalia are seen today
Amniotic fluid volume is normal
Estimated fetal weight is 1998g which is growth in the 76th
percentile
Recommendations

Findings discussed with the patient today. Follow-up
ultrasounds as clinically indicated.

## 2020-03-19 ENCOUNTER — Emergency Department (HOSPITAL_COMMUNITY)
Admission: EM | Admit: 2020-03-19 | Discharge: 2020-03-19 | Disposition: A | Discharge status: Home / Self Care | Payer: Self-pay | Attending: Emergency Medicine | Admitting: Emergency Medicine

## 2020-03-19 ENCOUNTER — Other Ambulatory Visit: Payer: Self-pay

## 2020-03-19 ENCOUNTER — Encounter (HOSPITAL_COMMUNITY): Payer: Self-pay | Admitting: Emergency Medicine

## 2020-03-19 DIAGNOSIS — R102 Pelvic and perineal pain: Secondary | ICD-10-CM | POA: Insufficient documentation

## 2020-03-19 DIAGNOSIS — N939 Abnormal uterine and vaginal bleeding, unspecified: Secondary | ICD-10-CM | POA: Insufficient documentation

## 2020-03-19 LAB — COMPREHENSIVE METABOLIC PANEL
ALT: 18 U/L (ref 0–44)
AST: 19 U/L (ref 15–41)
Albumin: 3.8 g/dL (ref 3.5–5.0)
Alkaline Phosphatase: 48 U/L (ref 38–126)
Anion gap: 10 (ref 5–15)
BUN: 7 mg/dL (ref 6–20)
CO2: 23 mmol/L (ref 22–32)
Calcium: 9.6 mg/dL (ref 8.9–10.3)
Chloride: 106 mmol/L (ref 98–111)
Creatinine, Ser: 0.76 mg/dL (ref 0.44–1.00)
GFR, Estimated: >60 mL/min (ref >60)
Glucose, Bld: 93 mg/dL (ref 70–99)
Potassium: 3.5 mmol/L (ref 3.5–5.1)
Sodium: 139 mmol/L (ref 135–145)
Total Bilirubin: 0.7 mg/dL (ref 0.3–1.2)
Total Protein: 7.6 g/dL (ref 6.5–8.1)

## 2020-03-19 LAB — CBC WITH DIFFERENTIAL/PLATELET
Abs Immature Granulocytes: 0.01 10*3/uL (ref 0.00–0.07)
Basophils Absolute: 0 10*3/uL (ref 0.0–0.1)
Basophils Relative: 0 %
Eosinophils Absolute: 0.1 10*3/uL (ref 0.0–0.5)
Eosinophils Relative: 2 %
HCT: 35 % — ABNORMAL LOW (ref 36.0–46.0)
Hemoglobin: 12.7 g/dL (ref 12.0–15.0)
Immature Granulocytes: 0 %
Lymphocytes Relative: 58 %
Lymphs Abs: 3.3 10*3/uL (ref 0.7–4.0)
MCH: 31.1 pg (ref 26.0–34.0)
MCHC: 36.3 g/dL — ABNORMAL HIGH (ref 30.0–36.0)
MCV: 85.6 fL (ref 80.0–100.0)
Monocytes Absolute: 0.6 10*3/uL (ref 0.1–1.0)
Monocytes Relative: 9 %
Neutro Abs: 1.8 10*3/uL (ref 1.7–7.7)
Neutrophils Relative %: 31 %
Platelets: 234 10*3/uL (ref 150–400)
RBC: 4.09 MIL/uL (ref 3.87–5.11)
RDW: 11.8 % (ref 11.5–15.5)
WBC: 5.9 10*3/uL (ref 4.0–10.5)
nRBC: 0 % (ref 0.0–0.2)

## 2020-03-19 LAB — URINALYSIS, ROUTINE W REFLEX MICROSCOPIC
Bilirubin Urine: NEGATIVE
Glucose, UA: NEGATIVE mg/dL
Ketones, ur: NEGATIVE mg/dL
Leukocytes,Ua: NEGATIVE
Nitrite: NEGATIVE
Protein, ur: 30 mg/dL — AB
RBC / HPF: >50 RBC/hpf — ABNORMAL HIGH (ref 0–5)
Specific Gravity, Urine: 1.021 (ref 1.005–1.030)
pH: 5 (ref 5.0–8.0)

## 2020-03-19 LAB — WET PREP, GENITAL
Clue Cells Wet Prep HPF POC: NONE SEEN
Sperm: NONE SEEN
Trich, Wet Prep: NONE SEEN
Yeast Wet Prep HPF POC: NONE SEEN

## 2020-03-19 LAB — LIPASE, BLOOD: Lipase: 24 U/L (ref 11–51)

## 2020-03-19 LAB — I-STAT BETA HCG BLOOD, ED (MC, WL, AP ONLY): I-stat hCG, quantitative: <5 m[IU]/mL (ref <5)

## 2020-03-19 NOTE — ED Provider Notes (Signed)
[facility] EMERGENCY DEPARTMENT Provider Note   CSN: [id number] Arrival date & time: 03/19/20  1203     History No chief complaint on file. Pelvic pain, vaginal bleeding.  Courtney Hodge is a 30 y.o. female who presents today for evaluation of midline lower abdominal pain and vaginal bleeding over the past 3 to 4 days.  She reports that she normally has a menstrual cycle at the beginning of every month however did not have 1 this month.  She states that the pain and bleeding are both improving.  She states that this does not feel like menstrual cramps that she normally does not have pain with her menstrual cycles.  She is sexually active with her husband, denies concern for any tear or trauma.  She denies any fevers.  Last bowel movement was yesterday and was normal.  No dysuria, increased frequency or urgency.  She denies any abnormal discharge prior to the pain and bleeding starting.    All interactions with patient performed through professional Swahili speaking medical interpreter.  HPI     Past Medical History:  Diagnosis Date  . Medical history non-contributory     Patient Active Problem List   Diagnosis Date Noted  . Language barrier, cultural differences 02/02/2016    Past Surgical History:  Procedure Laterality Date  . NO PAST SURGERIES       OB History    Gravida  5   Para  5   Term  5   Preterm  0   AB  0   Living  5     SAB  0   IAB  0   Ectopic  0   Multiple  0   Live Births  5           No family history on file.  Social History   Tobacco Use  . Smoking status: Never Smoker  . Smokeless tobacco: Never Used  Substance Use Topics  . Alcohol use: No  . Drug use: No    Home Medications Prior to Admission medications   Medication Sig Start Date End Date Taking? Authorizing Provider  ibuprofen (ADVIL,MOTRIN) 600 MG tablet Take 1 tablet (600 mg total) by mouth every 6 (six) hours. Patient not taking: Reported on  06/17/2016 05/08/16   Allie Bossier, MD  Prenatal Multivit-Min-Fe-FA (PRENATAL VITAMINS) 0.8 MG tablet Take 1 tablet by mouth daily. Patient not taking: Reported on 05/05/2016 02/09/16   Lorne Skeens, MD  terconazole (TERAZOL 7) 0.4 % vaginal cream Place 1 applicator vaginally at bedtime. Patient not taking: Reported on 03/15/2016 03/01/16   Hurshel Party, CNM    Allergies    Patient has no known allergies.  Review of Systems   Review of Systems  Constitutional: Negative for chills, fatigue and fever.  Respiratory: Negative for cough and shortness of breath.   Cardiovascular: Negative for chest pain.  Gastrointestinal: Negative for abdominal pain.  Genitourinary: Positive for menstrual problem, pelvic pain and vaginal discharge. Negative for dysuria, frequency, urgency and vaginal pain.  Musculoskeletal: Negative for back pain.  Skin: Negative for color change.  Neurological: Negative for weakness and headaches.  All other systems reviewed and are negative.   Physical Exam Updated Vital Signs BP (!) 162/100   Pulse 80   Temp 98.5 F (36.9 C) (Oral)   Resp 18   SpO2 100%   Physical Exam Vitals and nursing note reviewed. Exam conducted with a chaperone present Dwana Curd Tech).  Constitutional:  General: She is not in acute distress.    Appearance: She is well-developed and well-nourished. She is not ill-appearing.  HENT:     Head: Normocephalic and atraumatic.  Eyes:     Conjunctiva/sclera: Conjunctivae normal.  Cardiovascular:     Rate and Rhythm: Normal rate and regular rhythm.  Pulmonary:     Effort: Pulmonary effort is normal. No respiratory distress.  Abdominal:     General: There is no distension.     Palpations: Abdomen is soft. There is no mass.     Tenderness: There is abdominal tenderness (Mild, midline lower abdomen).  Genitourinary:    Comments: Normal external female genitalia.  There is a moderate amount of blood in the vaginal canal.   Cervix is closed.  No CMT.  No adnexal tenderness or fullness Musculoskeletal:        General: No edema.     Cervical back: Neck supple.     Right lower leg: No edema.     Left lower leg: No edema.  Skin:    General: Skin is warm and dry.  Neurological:     General: No focal deficit present.     Mental Status: She is alert.  Psychiatric:        Mood and Affect: Mood and affect and mood normal.        Behavior: Behavior normal.     ED Results / Procedures / Treatments   Labs (all labs ordered are listed, but only abnormal results are displayed) Labs Reviewed  WET PREP, GENITAL - Abnormal; Notable for the following components:      Result Value   WBC, Wet Prep HPF POC FEW (*)    All other components within normal limits  CBC WITH DIFFERENTIAL/PLATELET - Abnormal; Notable for the following components:   HCT 35.0 (*)    MCHC 36.3 (*)    All other components within normal limits  URINALYSIS, ROUTINE W REFLEX MICROSCOPIC - Abnormal; Notable for the following components:   APPearance HAZY (*)    Hgb urine dipstick LARGE (*)    Protein, ur 30 (*)    RBC / HPF >50 (*)    Bacteria, UA RARE (*)    Non Squamous Epithelial 0-5 (*)    All other components within normal limits  COMPREHENSIVE METABOLIC PANEL  LIPASE, BLOOD  I-STAT BETA HCG BLOOD, ED ([facility], [facility], [facility] ONLY)  GC/CHLAMYDIA PROBE AMP ([place]) NOT AT [facility]    EKG None  Radiology No results found.  Procedures Procedures   Medications Ordered in ED Medications - No data to display  ED Course  I have reviewed the triage vital signs and the nursing notes.  Pertinent labs & imaging results that were available during my care of the patient were reviewed by me and considered in my medical decision making (see chart for details).    MDM Rules/Calculators/A&P                         Patient is a 30 year old woman who presents today for evaluation of 2 to 3 days of vaginal bleeding and pelvic pain.  She normally has  regular cycles however missed her cycle earlier this month and states she does not normally have pain or discomfort with her menstrual cycles. On exam she has mild midline lower abdominal tenderness to palpation and is overall well-appearing.  She reports that her bleeding and pain have both been improving despite not receiving specific treatment.  She is not anemic, does not have a significant leukocytosis.  CMP and lipase are unremarkable.  Pregnancy test is negative.  UA has over 50 red cells with rare bacteria and 6-10 squamous epithelial cells, this appears to be contaminated.  She does not have CVA tenderness and her symptoms do not appear consistent with renal stone.  Here she is afebrile, not tachycardic or tachypneic and overall well-appearing. On pelvic exam she does not have cervical motion tenderness and mass or adnexal tenderness or fullness.  Doubt PID, TOA, torsion.  Wet prep is obtained showing few wbc.  GC testing is sent.  Given the patient's pain and bleeding in both been improving recommended conservative care at home including ibuprofen and Tylenol.  Given the midline nature with reassuring lab work doubt appendicitis, diverticulitis or other intra-abdominal cause.   HTN discussed with patient  The emergency room patient's blood pressure was elevated.  She was instructed to follow-up with primary care doctor.  Return precautions were discussed with patient who states their understanding.  At the time of discharge patient denied any unaddressed complaints or concerns.  Patient is agreeable for discharge home.  Note: Portions of this report may have been transcribed using voice recognition software. Every effort was made to ensure accuracy; however, inadvertent computerized transcription errors may be present   Final Clinical Impression(s) / ED Diagnoses Final diagnoses:  Pelvic pain in female    Rx / DC Orders ED Discharge Orders    None       Cristina Gong,  PA-C 03/19/20 2013    Rozelle Logan, DO 03/20/20 [phone]

## 2020-03-19 NOTE — Discharge Instructions (Addendum)
Please take Ibuprofen (Advil, motrin) and Tylenol (acetaminophen) to relieve your pain.    You may take up to 600 MG (3 pills) of normal strength ibuprofen every 8 hours as needed.   You make take tylenol, up to 1,000 mg (two extra strength pills) every 8 hours as needed.   It is safe to take ibuprofen and tylenol at the same time as they work differently.   Do not take more than 3,000 mg tylenol in a 24 hour period (not more than one dose every 8 hours.  Please check all medication labels as many medications such as pain and cold medications may contain tylenol.  Do not drink alcohol while taking these medications.  Do not take other NSAID'S while taking ibuprofen (such as aleve or naproxen).  Please take ibuprofen with food to decrease stomach upset.  If you develop fevers, vomiting, your pain worsens, you develop more pain on either the right or left side or have other concerns please seek additional medical care and evaluation.  While in the ED your blood pressure was high.  Please follow up with your primary care doctor or the wellness clinic for repeat evaluation as you may need medication.  High blood pressure can cause long term, potentially serious, damage if left untreated.

## 2020-03-19 NOTE — ED Triage Notes (Signed)
Pt here from home with c/o abd pain over the last 4 days passing some blood vaginally , unknown if pregnant , info obtain via phone interpreter

## 2020-03-19 NOTE — ED Notes (Signed)
Patient discharge instructions reviewed with the patient. The patient verbalized understanding. Patient discharged. 

## 2020-03-20 ENCOUNTER — Other Ambulatory Visit: Payer: Self-pay

## 2020-03-20 ENCOUNTER — Emergency Department (HOSPITAL_COMMUNITY): Admission: EM | Admit: 2020-03-20 | Payer: Medicaid Other | Source: Home / Self Care

## 2020-03-20 LAB — GC/CHLAMYDIA PROBE AMP (~~LOC~~) NOT AT ARMC
Chlamydia: NEGATIVE
Comment: NEGATIVE
Comment: NORMAL
Neisseria Gonorrhea: NEGATIVE

## 2020-04-09 ENCOUNTER — Other Ambulatory Visit: Payer: Self-pay

## 2020-04-09 ENCOUNTER — Encounter (HOSPITAL_COMMUNITY): Payer: Self-pay

## 2020-04-09 ENCOUNTER — Emergency Department (HOSPITAL_COMMUNITY)
Admission: EM | Admit: 2020-04-09 | Discharge: 2020-04-09 | Disposition: A | Discharge status: Home / Self Care | Payer: Medicaid Other | Attending: Emergency Medicine | Admitting: Emergency Medicine

## 2020-04-09 ENCOUNTER — Emergency Department (HOSPITAL_COMMUNITY): Payer: Medicaid Other

## 2020-04-09 ENCOUNTER — Ambulatory Visit (HOSPITAL_COMMUNITY)
Admission: EM | Admit: 2020-04-09 | Discharge: 2020-04-09 | Disposition: A | Discharge status: Home / Self Care | Payer: Medicaid Other | Attending: Internal Medicine | Admitting: Internal Medicine

## 2020-04-09 ENCOUNTER — Encounter (HOSPITAL_COMMUNITY): Payer: Self-pay | Admitting: Emergency Medicine

## 2020-04-09 DIAGNOSIS — R0789 Other chest pain: Secondary | ICD-10-CM

## 2020-04-09 DIAGNOSIS — R002 Palpitations: Secondary | ICD-10-CM | POA: Insufficient documentation

## 2020-04-09 DIAGNOSIS — R9431 Abnormal electrocardiogram [ECG] [EKG]: Secondary | ICD-10-CM

## 2020-04-09 DIAGNOSIS — R030 Elevated blood-pressure reading, without diagnosis of hypertension: Secondary | ICD-10-CM

## 2020-04-09 DIAGNOSIS — R079 Chest pain, unspecified: Secondary | ICD-10-CM | POA: Insufficient documentation

## 2020-04-09 DIAGNOSIS — R61 Generalized hyperhidrosis: Secondary | ICD-10-CM | POA: Insufficient documentation

## 2020-04-09 LAB — BASIC METABOLIC PANEL
Anion gap: 9 (ref 5–15)
BUN: <5 mg/dL — ABNORMAL LOW (ref 6–20)
CO2: 23 mmol/L (ref 22–32)
Calcium: 9.2 mg/dL (ref 8.9–10.3)
Chloride: 106 mmol/L (ref 98–111)
Creatinine, Ser: 0.72 mg/dL (ref 0.44–1.00)
GFR, Estimated: >60 mL/min (ref >60)
Glucose, Bld: 99 mg/dL (ref 70–99)
Potassium: 3.3 mmol/L — ABNORMAL LOW (ref 3.5–5.1)
Sodium: 138 mmol/L (ref 135–145)

## 2020-04-09 LAB — CBC
HCT: 36.8 % (ref 36.0–46.0)
Hemoglobin: 12.9 g/dL (ref 12.0–15.0)
MCH: 30.6 pg (ref 26.0–34.0)
MCHC: 35.1 g/dL (ref 30.0–36.0)
MCV: 87.4 fL (ref 80.0–100.0)
Platelets: 198 10*3/uL (ref 150–400)
RBC: 4.21 MIL/uL (ref 3.87–5.11)
RDW: 11.9 % (ref 11.5–15.5)
WBC: 4.9 10*3/uL (ref 4.0–10.5)
nRBC: 0 % (ref 0.0–0.2)

## 2020-04-09 LAB — TROPONIN I (HIGH SENSITIVITY): Troponin I (High Sensitivity): 2 ng/L (ref <18)

## 2020-04-09 LAB — I-STAT BETA HCG BLOOD, ED (MC, WL, AP ONLY): I-stat hCG, quantitative: <5 m[IU]/mL (ref <5)

## 2020-04-09 MED ORDER — NAPROXEN 375 MG PO TABS: 375.0000 mg | ORAL_TABLET | Freq: Two times a day (BID) | ORAL | 0 refills | Status: DC

## 2020-04-09 NOTE — ED Triage Notes (Signed)
Patient complains of intermittent chest pain and feeling like her heart is beating rapidly that started two weeks ago. Denies nausea, shortness of breath. Patient alert, oriented, and ambulatory at this time.

## 2020-04-09 NOTE — Discharge Instructions (Addendum)
Head straight to [facility] ED. If you experience worsening of chest pain, dizziness, worst headache of life, vision changes, etc while on the way- stop and immediately call EMS.

## 2020-04-09 NOTE — ED Triage Notes (Signed)
Pt c/o elevated heart rate and chest pain X 2 weeks. Pt states she tried using hot water to relieve the pain,states she felt no relief.

## 2020-04-09 NOTE — ED Provider Notes (Signed)
[facility]    CSN: [id number] Arrival date & time: 04/09/20  0854      History   Chief Complaint Chief Complaint  Patient presents with  . Chest Pain  . Elevated Heart Rate     HPI Courtney Hodge is a 30 y.o. female presenting with 2 weeks of intermittent left-sided chest pain and palpitations.  Language barrier, interpreter used for translation today.  She is currently experiencing this chest pain. patient describes 2 weeks of sensation of heart racing accompanied by chest pain and feeling like "chest is warm". Sometimes headaches also accompany these episodes.  The episodes happen randomly and without trigger, and terminate on their own after several minutes.  Denies dizziness, but states that she has to sit down when episodes occur. she denies history of cardiac issues in the past, denies history of early family cardiac death. Currently denies palpitations, worst headache of life, thunderclap headache, weakness/sensation changes in arms/legs, vision changes, shortness of breath,  photophobia, phonophobia, n/v/d.    HPI  Past Medical History:  Diagnosis Date  . Medical history non-contributory     Patient Active Problem List   Diagnosis Date Noted  . Language barrier, cultural differences 02/02/2016    Past Surgical History:  Procedure Laterality Date  . NO PAST SURGERIES      OB History    Gravida  5   Para  5   Term  5   Preterm  0   AB  0   Living  5     SAB  0   IAB  0   Ectopic  0   Multiple  0   Live Births  5            Home Medications    Prior to Admission medications   Medication Sig Start Date End Date Taking? Authorizing Provider  ibuprofen (ADVIL,MOTRIN) 600 MG tablet Take 1 tablet (600 mg total) by mouth every 6 (six) hours. Patient not taking: Reported on 06/17/2016 05/08/16   Allie Bossier, MD  Prenatal Multivit-Min-Fe-FA (PRENATAL VITAMINS) 0.8 MG tablet Take 1 tablet by mouth daily. Patient not taking: Reported on  05/05/2016 02/09/16   Lorne Skeens, MD  terconazole (TERAZOL 7) 0.4 % vaginal cream Place 1 applicator vaginally at bedtime. Patient not taking: Reported on 03/15/2016 03/01/16   Hurshel Party, CNM    Family History History reviewed. No pertinent family history.  Social History Social History   Tobacco Use  . Smoking status: Never Smoker  . Smokeless tobacco: Never Used  Substance Use Topics  . Alcohol use: No  . Drug use: No     Allergies   Patient has no known allergies.   Review of Systems Review of Systems  Cardiovascular: Positive for chest pain and palpitations.  All other systems reviewed and are negative.    Physical Exam Triage Vital Signs ED Triage Vitals  Enc Vitals Group     BP 04/09/20 0914 (!) 144/81     Pulse Rate 04/09/20 0914 73     Resp 04/09/20 0914 18     Temp 04/09/20 0914 99 F (37.2 C)     Temp Source 04/09/20 0914 Oral     SpO2 04/09/20 0914 100 %     Weight --      Height --      Head Circumference --      Peak Flow --      Pain Score 04/09/20 0911 10  Pain Loc --      Pain Edu? --      Excl. in GC? --    No data found.  Updated Vital Signs BP (!) 144/81 (BP Location: Right Arm)   Pulse 73   Temp 99 F (37.2 C) (Oral)   Resp 18   LMP 03/17/2020 (Exact Date)   SpO2 100%   Visual Acuity Right Eye Distance:   Left Eye Distance:   Bilateral Distance:    Right Eye Near:   Left Eye Near:    Bilateral Near:     Physical Exam Vitals reviewed.  Constitutional:      Appearance: She is well-developed.  Cardiovascular:     Rate and Rhythm: Normal rate and regular rhythm.     Pulses:          Radial pulses are 2+ on the right side and 2+ on the left side.     Heart sounds: Normal heart sounds.  Pulmonary:     Effort: Pulmonary effort is normal.     Breath sounds: Normal breath sounds.  Chest:     Chest wall: No tenderness.     Comments: Chest pain is not reproducible Musculoskeletal:     Right lower  leg: No edema.     Left lower leg: No edema.  Neurological:     General: No focal deficit present.     Mental Status: She is alert and oriented to person, place, and time.     Comments: CN 2-12 grossly intact  Psychiatric:        Mood and Affect: Mood normal.        Behavior: Behavior normal.      UC Treatments / Results  Labs (all labs ordered are listed, but only abnormal results are displayed) Labs Reviewed - No data to display  EKG   Radiology No results found.  Procedures Procedures (including critical care time)  Medications Ordered in UC Medications - No data to display  Initial Impression / Assessment and Plan / UC Course  I have reviewed the triage vital signs and the nursing notes.  Pertinent labs & imaging results that were available during my care of the patient were reviewed by me and considered in my medical decision making (see chart for details).       This patient is a 30 year old female presenting with palpitations and 10/10 left-sided chest pain for 2 weeks. Denies cardiac history, denies family history early cardiac death. Chest pain is not reproducible. Interpreter used for translation today.  EKG today with nonspecific T wave abnormalities. No past EKG for comparison.  I am sending this patient to [facility] ED for further cardiac workup. She understands if she experiences dizziness, worsening of chest pain, pain in left arm, loss of consciousness, worsening of life, vision changes, etc. she must immediately stop and call 911. She is hemodynamically stable to transport herself at this time.  Using language line, spent over 60 minutes obtaining H&P, performing physical, interpreting EKG, discussing results, treatment plan and plan for follow-up with patient. Patient agrees with plan.   This chart was dictated using voice recognition software, Dragon. Despite the best efforts of this provider to proofread and correct errors, errors may still occur  which can change documentation meaning.   Final Clinical Impressions(s) / UC Diagnoses   Final diagnoses:  Atypical chest pain  Nonspecific abnormal electrocardiogram (ECG) (EKG)  Elevated blood pressure reading without diagnosis of hypertension     Discharge Instructions  Head straight to [facility] ED. If you experience worsening of chest pain, dizziness, worst headache of life, vision changes, etc while on the way- stop and immediately call EMS.    ED Prescriptions    None     PDMP not reviewed this encounter.   Rhys Martini, PA-C 04/09/20 1029

## 2020-04-09 NOTE — Discharge Instructions (Addendum)
Follow-up with the primary care doctor for further evaluation if the symptoms persist.  Consider doing an outpatient cardiac monitor.

## 2020-04-09 NOTE — ED Provider Notes (Signed)
[name] EMERGENCY DEPARTMENT Provider Note Translator used, swahili  CSN: [id number] Arrival date & time: 04/09/20  1043     History Chief Complaint  Patient presents with  . Chest Pain    Courtney Hodge is a 30 y.o. female.  HPI  HPI: A 30 year old patient presents for evaluation of chest pain. Initial onset of pain was more than 6 hours ago. The patient's chest pain is sharp and is not worse with exertion. The patient reports some diaphoresis. The patient's chest pain is middle- or left-sided, is not well-localized, is not described as heaviness/pressure/tightness and does not radiate to the arms/jaw/neck. The patient does not complain of nausea. The patient has no history of stroke, has no history of peripheral artery disease, has not smoked in the past 90 days, denies any history of treated diabetes, has no relevant family history of coronary artery disease (first degree relative at less than age 69), is not hypertensive, has no history of hypercholesterolemia and does not have an elevated BMI (>=30).  Patient states she also also had episodes of heart racing sensation when this occurs.  She denies any history of heart disease.  No history of PE or DVT Past Medical History:  Diagnosis Date  . Medical history non-contributory     Patient Active Problem List   Diagnosis Date Noted  . Language barrier, cultural differences 02/02/2016    Past Surgical History:  Procedure Laterality Date  . NO PAST SURGERIES       OB History    Gravida  5   Para  5   Term  5   Preterm  0   AB  0   Living  5     SAB  0   IAB  0   Ectopic  0   Multiple  0   Live Births  5           No family history on file.  Social History   Tobacco Use  . Smoking status: Never Smoker  . Smokeless tobacco: Never Used  Substance Use Topics  . Alcohol use: No  . Drug use: No    Home Medications Prior to Admission medications   Medication Sig Start Date End  Date Taking? Authorizing Provider  naproxen (NAPROSYN) 375 MG tablet Take 1 tablet (375 mg total) by mouth 2 (two) times daily. 04/09/20  Yes Linwood Dibbles, MD    Allergies    Patient has no known allergies.  Review of Systems   Review of Systems  All other systems reviewed and are negative.   Physical Exam Updated Vital Signs BP (!) 143/93   Pulse 80   Temp 98.4 F (36.9 C) (Oral)   Resp (!) 28   LMP 03/17/2020 (Exact Date)   SpO2 100%   Physical Exam Vitals and nursing note reviewed.  Constitutional:      General: She is not in acute distress.    Appearance: She is well-developed and well-nourished.  HENT:     Head: Normocephalic and atraumatic.     Right Ear: External ear normal.     Left Ear: External ear normal.  Eyes:     General: No scleral icterus.       Right eye: No discharge.        Left eye: No discharge.     Conjunctiva/sclera: Conjunctivae normal.  Neck:     Trachea: No tracheal deviation.  Cardiovascular:     Rate and Rhythm: Normal rate and regular  rhythm.     Pulses: Intact distal pulses.  Pulmonary:     Effort: Pulmonary effort is normal. No respiratory distress.     Breath sounds: Normal breath sounds. No stridor. No wheezing or rales.  Abdominal:     General: Bowel sounds are normal. There is no distension.     Palpations: Abdomen is soft.     Tenderness: There is no abdominal tenderness. There is no guarding or rebound.  Musculoskeletal:        General: No tenderness or edema.     Cervical back: Neck supple.  Skin:    General: Skin is warm and dry.     Findings: No rash.  Neurological:     Mental Status: She is alert.     Cranial Nerves: No cranial nerve deficit (no facial droop, extraocular movements intact, no slurred speech).     Sensory: No sensory deficit.     Motor: No abnormal muscle tone or seizure activity.     Coordination: Coordination normal.     Deep Tendon Reflexes: Strength normal.  Psychiatric:        Mood and Affect: Mood  and affect normal.     ED Results / Procedures / Treatments   Labs (all labs ordered are listed, but only abnormal results are displayed) Labs Reviewed  BASIC METABOLIC PANEL - Abnormal; Notable for the following components:      Result Value   Potassium 3.3 (*)    BUN <5 (*)    All other components within normal limits  CBC  I-STAT BETA HCG BLOOD, ED ([facility], [facility], [facility] ONLY)  TROPONIN I (HIGH SENSITIVITY)  TROPONIN I (HIGH SENSITIVITY)    EKG EKG Interpretation  Date/Time:  Thursday April 09 2020 10:53:57 EST Ventricular Rate:  80 PR Interval:  166 QRS Duration: 86 QT Interval:  374 QTC Calculation: 431 R Axis:   43 Text Interpretation: Normal sinus rhythm Nonspecific ST abnormality Abnormal ECG No significant change since last tracing Confirmed by Linwood Dibbles [phone]) on 04/09/2020 1:42:16 PM   Radiology DG Chest 2 View  Result Date: 04/09/2020 CLINICAL DATA:  Chest pain EXAM: CHEST - 2 VIEW COMPARISON:  None. FINDINGS: Mild cardiomegaly. Both lungs are clear. No pleural effusion or pneumothorax. The visualized skeletal structures are unremarkable. IMPRESSION: Mild cardiomegaly.  Clear lungs. Electronically Signed   By: Guadlupe Spanish M.D.   On: 04/09/2020 11:34    Procedures Procedures   Medications Ordered in ED Medications - No data to display  ED Course  I have reviewed the triage vital signs and the nursing notes.  Pertinent labs & imaging results that were available during my care of the patient were reviewed by me and considered in my medical decision making (see chart for details).  Clinical Course as of 04/09/20 1546  Thu Apr 09, 2020  1527 Blood pressure has improved.  Now 142/ [JK]    Clinical Course User Index [JK] Linwood Dibbles, MD   MDM Rules/Calculators/A&P HEAR Score: 1                        Patient presented with chest pain and palpitations.  ED work-up is reassuring.  Patient was initially noted to be hypertensive at 169/103 but on subsequent  blood pressures down to 140s over 90s.  Patient may have a component of hypertension however she is having discomfort and I think it is reasonable for her to follow-up with a primary care doctor to recheck her  blood pressure and determine whether she might she needs to be on medication.  Patient is low risk for PE.  PERC negative.  Initial EKG chest x-ray blood tests and cardiac enzymes are normal.  I doubt ACS.  At this time the etiology of her symptoms unclear but she appears stable for discharge and outpatient follow-up.  Translator was used during the evaluation. Final Clinical Impression(s) / ED Diagnoses Final diagnoses:  Chest pain, unspecified type  Palpitation    Rx / DC Orders ED Discharge Orders         Ordered    naproxen (NAPROSYN) 375 MG tablet  2 times daily        04/09/20 1539           Linwood Dibbles, MD 04/09/20 1547

## 2021-05-26 ENCOUNTER — Other Ambulatory Visit (HOSPITAL_COMMUNITY)
Admission: RE | Admit: 2021-05-26 | Discharge: 2021-05-26 | Disposition: A | Discharge status: Home / Self Care | Payer: Medicaid Other | Source: Ambulatory Visit | Attending: Family Medicine | Admitting: Family Medicine

## 2021-05-26 ENCOUNTER — Ambulatory Visit (INDEPENDENT_AMBULATORY_CARE_PROVIDER_SITE_OTHER): Payer: Medicaid Other | Admitting: Family Medicine

## 2021-05-26 ENCOUNTER — Encounter: Payer: Self-pay | Admitting: Family Medicine

## 2021-05-26 VITALS — BP 146/87 | HR 79 | Ht 65.0 in | Wt 187.6 lb

## 2021-05-26 DIAGNOSIS — Z113 Encounter for screening for infections with a predominantly sexual mode of transmission: Secondary | ICD-10-CM | POA: Diagnosis present

## 2021-05-26 DIAGNOSIS — Z0000 Encounter for general adult medical examination without abnormal findings: Secondary | ICD-10-CM | POA: Diagnosis not present

## 2021-05-26 DIAGNOSIS — Z3169 Encounter for other general counseling and advice on procreation: Secondary | ICD-10-CM | POA: Diagnosis not present

## 2021-05-26 NOTE — Progress Notes (Signed)
Patient presents as a New patient to establish care. Patient has no concerns today. ? ?Last pap: 2017 Normal ?

## 2021-05-26 NOTE — Progress Notes (Signed)
? ? ?GYNECOLOGY ANNUAL PREVENTATIVE CARE ENCOUNTER NOTE ? ?History:    ? Courtney Hodge is a 31 y.o. [id number] female here for a routine annual gynecologic exam. Requests pap smear and breast exam today.  ? ?At the end of the visit, she mentioned difficulty conceiving. She has five children (delivered vaginally) with her current partner. They have been sexually active without contraception since 2018. Reports they have not always been active around expected ovulation, but understands when that would be in relation to her cycle. Did not have difficulty conceiving with her other children. Not taking any PNV. Not on any medications. No previous pelvic surgeries.  ? ?Her cycles are monthly/regular. Last 2-3 days. Denies any breast soreness, cramping/bloating, or fatigue prior to or during cycle.  ? ?An in-person swahili interpreter was used for the duration of the visit.  ?  ?Gynecologic History ?Patient's last menstrual period was 05/19/2021. ?Contraception: none ?Last Pap: 01/2016. Results were: normal  ?Last mammogram: Never.  ? ?Obstetric History ?OB History  ?Gravida Para Term Preterm AB Living  ?_0 0 0 5  ?SAB IAB Ectopic Multiple Live Births  ?0 0 0 0 5  ?  ?# Outcome Date GA Lbr Len/2nd Weight Sex Delivery Anes PTL Lv  ?5 Term 05/06/16 [id number] / 00:16 7 lb 11.8 oz (3.51 kg) F Vag-Spont None  LIV  ?   Birth Comments: WNL  ?4 Term 10/14/13   6 lb 13.4 oz (3.1 kg) M Vag-Spont None N LIV  ?3 Term 05/11/12 [id number] lb 6 oz (3.8 kg) F Vag-Spont None N LIV  ?2 Term 06/11/10 [id number] lb 0.9 oz (3.2 kg) F Vag-Spont None  LIV  ?1 Term 11/23/06 [id number] 7.9 oz (3.4 kg) M Vag-Spont None N LIV  ? ? ?Past Medical History:  ?Diagnosis Date  ? Medical history non-contributory   ? ? ?Past Surgical History:  ?Procedure Laterality Date  ? NO PAST SURGERIES    ? ? ?No current outpatient medications on file prior to visit.  ? ?No current facility-administered medications on file prior to visit.  ? ? ?No Known  Allergies ? ?Social History:  reports that she has never smoked. She has never used smokeless tobacco. She reports that she does not drink alcohol and does not use drugs. ? ?History reviewed. No pertinent family history. ? ?The following portions of the patient's history were reviewed and updated as appropriate: allergies, current medications, past family history, past medical history, past social history, past surgical history and problem list. ? ?Review of Systems ?Pertinent items noted in HPI and remainder of comprehensive ROS otherwise negative. ? ?Physical Exam:  ?BP (!) 146/87   Pulse 79   Ht _1  (1.651 m)   Wt 187 lb 9.6 oz (85.1 kg)   LMP 05/19/2021   BMI 31.22 kg/m?  ?CONSTITUTIONAL: Well-developed, well-nourished female in no acute distress.  ?HENT:  Normocephalic, atraumatic. Oropharynx is clear and moist ?EYES: Conjunctivae and EOM are normal. Pupils are equal, round. ?NECK: Normal range of motion, supple. ?SKIN: Skin is warm and dry. No rash noted. Not diaphoretic. No erythema. No pallor. ?MUSCULOSKELETAL: Normal range of motion. No tenderness.  No cyanosis, clubbing, or edema.  2+ distal pulses. ?NEUROLOGIC: Alert and oriented to person, place, and time.  ?PSYCHIATRIC: Normal mood and affect. Normal behavior. Normal judgment and thought content. ?CARDIOVASCULAR: Normal heart rate noted ?RESPIRATORY: Normal WOB.  ?BREASTS: Symmetric in size. No masses, tenderness, skin changes, nipple drainage,  or lymphadenopathy bilaterally. Performed in the presence of a chaperone. ?ABDOMEN: Soft, no distention noted.  No tenderness, rebound or guarding.  ?PELVIC: Normal appearing external genitalia and urethral meatus; normal appearing vaginal mucosa and multiparous cervix.  No abnormal discharge noted.  Pap smear obtained.  Normal uterine size, no other palpable masses, no uterine or adnexal tenderness noted.  Performed in the presence of a chaperone. ?  ?Assessment and Plan:  ? ?1. Annual physical  exam ?Normal pelvic. Will f/u pap and treat accordingly.  ? ?2. Infertility counseling ?Discussed timing intercourse for predicted ovulation time in reference to her cycle (about a 1 week span). Do question if she may not be ovulating with absence of molimina symptoms prior to cycle, recommended OTC ovulation predictor kit to assess. Did have difficulty with language barrier explaining the kit-- may need to do day 21 progesterone level if she is still having trouble. Start PNV.  ?- TSH ?- Comprehensive metabolic panel ?- Hemoglobin A1c  ? ?3. Screening examination for STD (sexually transmitted disease) ?No exposure or symptoms.  ?- HIV antibody (with reflex) ?- RPR ?- Hepatitis C Antibody ?- Cytology - PAP( [facility]) ? ?4. Elevated blood pressure without diagnosis  ?BP elevated today. Reports she has never been told she has high blood pressure, but note several elevated Bps in previous ED/office visits. Discussed monitoring BP at home, she is going to get a BP cuff.  ? ?Follow up in 1-2 months or sooner if needed.  ? ?Update: patient became pre-syncopal with lab draw and was not able to get all of her labs. Will follow up with ones completed and attempt in the future if needed.  ?   ? ?Darrelyn Hillock, DO  ?OB Fellow, Faculty Practice ?[place] for [facility] ?05/26/2021 10:49 AM ? ?

## 2021-05-26 NOTE — Patient Instructions (Signed)
Goal BP <130/80

## 2021-05-28 ENCOUNTER — Encounter: Payer: Self-pay | Admitting: Family Medicine

## 2021-05-28 LAB — COMPREHENSIVE METABOLIC PANEL
ALT: 10 IU/L (ref 0–32)
AST: 13 IU/L (ref 0–40)
Albumin/Globulin Ratio: 1.8 (ref 1.2–2.2)
Albumin: 4.3 g/dL (ref 3.8–4.8)
Alkaline Phosphatase: 57 IU/L (ref 44–121)
BUN/Creatinine Ratio: 14 (ref 9–23)
BUN: 9 mg/dL (ref 6–20)
Bilirubin Total: 0.3 mg/dL (ref 0.0–1.2)
CO2: 20 mmol/L (ref 20–29)
Calcium: 9.6 mg/dL (ref 8.7–10.2)
Chloride: 105 mmol/L (ref 96–106)
Creatinine, Ser: 0.63 mg/dL (ref 0.57–1.00)
Globulin, Total: 2.4 g/dL (ref 1.5–4.5)
Glucose: 103 mg/dL — ABNORMAL HIGH (ref 70–99)
Potassium: 4.2 mmol/L (ref 3.5–5.2)
Sodium: 140 mmol/L (ref 134–144)
Total Protein: 6.7 g/dL (ref 6.0–8.5)
eGFR: 122 mL/min/{1.73_m2} (ref >59)

## 2021-05-28 LAB — HEPATITIS C ANTIBODY: Hep C Virus Ab: NONREACTIVE

## 2021-05-28 LAB — RPR: RPR Ser Ql: NONREACTIVE

## 2021-05-28 LAB — HIV ANTIBODY (ROUTINE TESTING W REFLEX): HIV Screen 4th Generation wRfx: NONREACTIVE

## 2021-05-28 LAB — TSH: TSH: 2.57 u[IU]/mL (ref 0.450–4.500)

## 2021-05-28 LAB — HEMOGLOBIN A1C

## 2021-06-01 LAB — CYTOLOGY - PAP
Comment: NEGATIVE
Diagnosis: NEGATIVE
High risk HPV: NEGATIVE

## 2021-10-13 ENCOUNTER — Ambulatory Visit: Payer: Medicaid Other | Admitting: Family Medicine

## 2021-11-29 ENCOUNTER — Ambulatory Visit (INDEPENDENT_AMBULATORY_CARE_PROVIDER_SITE_OTHER): Payer: Self-pay | Admitting: *Deleted

## 2021-11-29 ENCOUNTER — Other Ambulatory Visit: Payer: Self-pay | Admitting: *Deleted

## 2021-11-29 VITALS — BP 149/93 | HR 80

## 2021-11-29 DIAGNOSIS — N949 Unspecified condition associated with female genital organs and menstrual cycle: Secondary | ICD-10-CM

## 2021-11-29 DIAGNOSIS — Z758 Other problems related to medical facilities and other health care: Secondary | ICD-10-CM

## 2021-11-29 DIAGNOSIS — Z3202 Encounter for pregnancy test, result negative: Secondary | ICD-10-CM

## 2021-11-29 LAB — POCT URINE PREGNANCY: Preg Test, Ur: NEGATIVE

## 2021-11-29 NOTE — Progress Notes (Signed)
Swahili interpreter used.  Courtney Hodge presents today for UPT. She has no unusual complaints.   BP is elevated today.  Pt reports irregular cycle. Pt is asking for assistance with cycle regulation and desires pregnancy. Pt advised to schedule appt with provider to discuss cycle regulation.  LMP: August    OBJECTIVE: Appears well, in no apparent distress.  OB History     Gravida  5   Para  5   Term  5   Preterm  0   AB  0   Living  5      SAB  0   IAB  0   Ectopic  0   Multiple  0   Live Births  5          Home UPT Result: NA In-Office UPT result: Negative I have reviewed the patient's medical, obstetrical, social, and family histories, and medications.   ASSESSMENT: Negative pregnancy test  PLAN Appt to follow up with provider in the office scheduled at check out. Referral to PCP placed. Education provided on HTN.

## 2021-12-22 ENCOUNTER — Ambulatory Visit: Payer: Medicaid Other | Admitting: Obstetrics and Gynecology

## 2021-12-22 VITALS — BP 164/88 | HR 67 | Wt 184.0 lb

## 2021-12-22 DIAGNOSIS — Z603 Acculturation difficulty: Secondary | ICD-10-CM | POA: Diagnosis not present

## 2021-12-22 DIAGNOSIS — N926 Irregular menstruation, unspecified: Secondary | ICD-10-CM

## 2021-12-22 DIAGNOSIS — Z3042 Encounter for surveillance of injectable contraceptive: Secondary | ICD-10-CM

## 2021-12-22 HISTORY — DX: Irregular menstruation, unspecified: N92.6

## 2021-12-22 NOTE — Progress Notes (Signed)
Video interpreter # [phone]  Pt states she had gotten BC injection (2020) and has not had regular cycle since.    Pt states LMP 10/28/21 - heavy, clot flow. Usually lasting around 3 days.  Pt states one month she may have a "flow" and the next month she may only have "lumps" that come out.  Pt desires pregnancy.

## 2021-12-22 NOTE — Progress Notes (Signed)
  CC: irregular menses Subjective:    Patient ID: Courtney Hodge, female    DOB: May 03, 1990, 31 y.o.   MRN: [id number]  HPI 31 yo G5P5, SVD x 5, seen for discussion of irregular menses.  Pt has had irregular menses since her depo provera was stopped in 2020.  Currently, she averages a menstrual cycle lasting 3-4 days.  She will occasionally skip menses.  When she does have menstrual bleeding she may have clots or just one day of bleeding.  She has had no lab testing or any recent imaging of the uterus.  New hypertension noted today as well.   Review of Systems     Objective:   Physical Exam Vitals:   12/22/21 1118 12/22/21 1136  BP: (!) 169/100 (!) 164/88  Pulse:  67         Assessment & Plan:   1. Irregular menses Discussed with pt need for uterine imaging and  lab work for evaluation.   Normally would use OCP for cycle control, but this may be an issue due to elevated blood pressure and desire for pregnancy.  Pt is aware cycle control may be contradictory to her getting pregnant. Will check labs  and pelvic ultrasound to begin evaluation.  BP check in 2 weeks Gyn follow up in 5 weeks  - Beta hCG quant (ref lab) - US PELVIC COMPLETE WITH TRANSVAGINAL; Future - Thyroid Panel With TSH - Follicle stimulating hormone  2. Language barrier, cultural differences Tele interpreter used.    Griffin Basil, MD Faculty Attending, [facility]

## 2021-12-23 ENCOUNTER — Ambulatory Visit: Payer: Medicaid Other | Admitting: Obstetrics and Gynecology

## 2021-12-23 LAB — THYROID PANEL WITH TSH
Free Thyroxine Index: 2.2 (ref 1.2–4.9)
T3 Uptake Ratio: 28 % (ref 24–39)
T4, Total: 7.7 ug/dL (ref 4.5–12.0)
TSH: 2.62 u[IU]/mL (ref 0.450–4.500)

## 2021-12-23 LAB — FOLLICLE STIMULATING HORMONE: FSH: 3.2 m[IU]/mL

## 2021-12-23 LAB — BETA HCG QUANT (REF LAB): hCG Quant: <1 m[IU]/mL

## 2021-12-29 ENCOUNTER — Ambulatory Visit (HOSPITAL_COMMUNITY)
Admission: RE | Admit: 2021-12-29 | Discharge: 2021-12-29 | Disposition: A | Discharge status: Home / Self Care | Payer: Medicaid Other | Source: Ambulatory Visit | Attending: Obstetrics and Gynecology | Admitting: Obstetrics and Gynecology

## 2021-12-29 DIAGNOSIS — N926 Irregular menstruation, unspecified: Secondary | ICD-10-CM | POA: Insufficient documentation

## 2022-01-05 ENCOUNTER — Ambulatory Visit (INDEPENDENT_AMBULATORY_CARE_PROVIDER_SITE_OTHER): Payer: Self-pay | Admitting: General Practice

## 2022-01-05 VITALS — BP 149/91 | HR 80 | Ht 65.0 in | Wt 183.0 lb

## 2022-01-05 DIAGNOSIS — I10 Essential (primary) hypertension: Secondary | ICD-10-CM

## 2022-01-05 MED ORDER — AMLODIPINE BESYLATE 2.5 MG PO TABS: 5.0000 mg | ORAL_TABLET | Freq: Every day | ORAL | Status: DC

## 2022-01-05 NOTE — Progress Notes (Signed)
Subjective:  Courtney Hodge is a 31 y.o. female here for BP check.   Hypertension ROS: no TIA's, no chest pain on exertion, no dyspnea on exertion, and no swelling of ankles. Pt c/o headaches. Blo9od pressure was elevated at last office visit 12-22-21. Blood pressure recheck performed today   Objective:  BP (!) 149/91   Ht 5\' 5"  (1.651 m)   Wt 183 lb (83 kg)   LMP 10/28/2021   BMI 30.45 kg/m   Appearance alert, well appearing, and in no distress and oriented to person, place, and time. General exam BP noted to be well controlled today in office.    Assessment:   Blood Pressure poorly controlled and needs further observation.   Plan:  Orders and follow up as documented in patient record. Amlodipine 5mg  sent to pharmacu per Ugonna Anyanwu . Pt referred to [facility] and wellness for a PCP and advised to f/u about blood pressure with PCP once care is establish.

## 2022-02-07 ENCOUNTER — Encounter: Payer: Self-pay | Admitting: Obstetrics and Gynecology

## 2022-02-07 ENCOUNTER — Ambulatory Visit (INDEPENDENT_AMBULATORY_CARE_PROVIDER_SITE_OTHER): Payer: Self-pay | Admitting: Obstetrics and Gynecology

## 2022-02-07 VITALS — BP 155/96 | HR 87 | Ht 65.0 in | Wt 178.0 lb

## 2022-02-07 DIAGNOSIS — I10 Essential (primary) hypertension: Secondary | ICD-10-CM

## 2022-02-07 DIAGNOSIS — N926 Irregular menstruation, unspecified: Secondary | ICD-10-CM

## 2022-02-07 NOTE — Progress Notes (Signed)
31 y.o GYN presents for FU.  BP elevated today, Pt does not have a PCP.  Last PAP 05/26/2021

## 2022-02-08 ENCOUNTER — Telehealth: Payer: Self-pay

## 2022-02-08 NOTE — Telephone Encounter (Signed)
  TC to Pt, no ans, unable to leave mess because VM is not set up.  Best options: 1. Progesterone IUD 2. Uterine ablation 3. Oral progesterone such as aygestin (increased dose)  Will contact patient with options and she may need endometrial biopsy as well.

## 2022-02-08 NOTE — Progress Notes (Signed)
  CC: irregular bleeding Subjective:    Patient ID: Courtney Hodge, female    DOB: [birth year], 31 y.o.   MRN: [id number]  HPI 31 yo G5P5 seen for discussion of irregular bleeding.  She notes she has had irregular bleeding for the last few months.  She stopped depo provera in 2020.  Reference my previous note.  Ultrasound and labs taken previously were normal.   Review of Systems     Objective:   Physical Exam Vitals:   02/07/22 1554 02/07/22 1604  BP: (!) 150/105 (!) 155/96  Pulse: 81 87   CLINICAL DATA:  Irregular menses   EXAM: TRANSABDOMINAL AND TRANSVAGINAL ULTRASOUND OF PELVIS   TECHNIQUE: Both transabdominal and transvaginal ultrasound examinations of the pelvis were performed. Transabdominal technique was performed for global imaging of the pelvis including uterus, ovaries, adnexal regions, and pelvic cul-de-sac. It was necessary to proceed with endovaginal exam following the transabdominal exam to visualize the uterus endometrium ovaries.   COMPARISON:  None Available.   FINDINGS: Uterus   Measurements: 9.5 x 4.1 x 5.8 cm = volume: 117.3 mL. No fibroids or other mass visualized.   Endometrium   Thickness: 7 mm.  No focal abnormality visualized.   Right ovary   Measurements: 4 x 1.8 x 2.2 cm = volume: 8.5 mL. Normal appearance/no adnexal mass.   Left ovary   Measurements: 3.6 x 1.8 x 2.1 cm = volume: 7.2 mL. Normal appearance/no adnexal mass.   Other findings   No abnormal free fluid.   IMPRESSION: Negative pelvic ultrasound.        Assessment & Plan:   1. Hypertension, unspecified type Pt does not have PCP , will refer to internal medicine for further surveillance and treatment.  - Ambulatory referral to Internal Medicine  2. Irregular menses Discussed treatment options with patient.  She desires to have cycles regulated, and not stopped.  As previous would not recommend estrogen containing treatments due to currently uncontrolled elevated  blood pressures.    Best option:   Progesterone IUD Uterine ablation Oral progesterone such as aygestin (increased dose)  Will contact patient with options and she may need endometrial biopsy as well. I spent 20 minutes dedicated to the care of this patient including previsit review of records, face to face time with the patient discussing treatment options and post visit testing.     Warden Fillers, MD Faculty Attending, [facility]

## 2022-03-29 ENCOUNTER — Telehealth: Payer: Self-pay

## 2022-03-29 NOTE — Telephone Encounter (Signed)
-----   Message from Griffin Basil, MD sent at 02/08/2022  2:01 PM EST ----- Regarding: treatment options Please relay treatment options to patient per my last note.  She also needs to be scheduled for an endometrial biopsy as well.    Thank you  Lynnda Shields, MD

## 2022-03-29 NOTE — Telephone Encounter (Signed)
Called patient via [facility] to discuss options for treatment and need for endometrial biopsy.   No answer. Left message on vm for patient to return call to the office.

## 2022-09-24 ENCOUNTER — Ambulatory Visit
Admission: RE | Admit: 2022-09-24 | Discharge: 2022-09-24 | Disposition: A | Discharge status: Home / Self Care | Payer: Medicaid Other | Source: Ambulatory Visit | Attending: Physician Assistant | Admitting: Physician Assistant

## 2022-09-24 VITALS — BP 162/96 | HR 80 | Temp 99.2°F | Resp 18 | Ht 65.0 in | Wt 177.9 lb

## 2022-09-24 DIAGNOSIS — I10 Essential (primary) hypertension: Secondary | ICD-10-CM | POA: Diagnosis not present

## 2022-09-24 DIAGNOSIS — R5383 Other fatigue: Secondary | ICD-10-CM | POA: Diagnosis not present

## 2022-09-24 DIAGNOSIS — N926 Irregular menstruation, unspecified: Secondary | ICD-10-CM | POA: Diagnosis not present

## 2022-09-24 LAB — POCT URINE PREGNANCY: Preg Test, Ur: NEGATIVE

## 2022-09-24 LAB — POCT URINALYSIS DIP (MANUAL ENTRY)
Bilirubin, UA: NEGATIVE
Blood, UA: NEGATIVE
Glucose, UA: NEGATIVE mg/dL
Ketones, POC UA: NEGATIVE mg/dL
Leukocytes, UA: NEGATIVE
Nitrite, UA: NEGATIVE
Protein Ur, POC: 30 mg/dL — AB
Spec Grav, UA: 1.02 (ref 1.010–1.025)
Urobilinogen, UA: 0.2 E.U./dL
pH, UA: 8.5 — AB (ref 5.0–8.0)

## 2022-09-24 MED ORDER — AMLODIPINE BESYLATE 5 MG PO TABS: 5.0000 mg | ORAL_TABLET | Freq: Every day | ORAL | 0 refills | Status: DC

## 2022-09-24 NOTE — ED Provider Notes (Signed)
[facility]    CSN: [id number] Arrival date & time: 09/24/22  0944      History   Chief Complaint Chief Complaint  Patient presents with   Fatigue    HPI Courtney Hodge is a 32 y.o. female.   Patient here today for evaluation of generalized fatigue and "feeling bad" for a long time.  She does have appointment with primary care but not until October.  She states that she has had some dizziness when she has been bending down at work.  She also reports some headaches, mainly to her posterior head.  She has some menstrual issues and does not have regular periods.  She denies any numbness or tingling.  She has not any vision changes.  She denies any chest pain or shortness of breath.  She does have history of hypertension but has not taken medication for same.  The history is provided by the patient.    Past Medical History:  Diagnosis Date   Medical history non-contributory     Patient Active Problem List   Diagnosis Date Noted   Irregular menses 12/22/2021   Language barrier, cultural differences 02/02/2016    Past Surgical History:  Procedure Laterality Date   NO PAST SURGERIES      OB History     Gravida  5   Para  5   Term  5   Preterm  0   AB  0   Living  5      SAB  0   IAB  0   Ectopic  0   Multiple  0   Live Births  5            Home Medications    Prior to Admission medications   Medication Sig Start Date End Date Taking? Authorizing Provider  amLODipine (NORVASC) 5 MG tablet Take 1 tablet (5 mg total) by mouth daily. 09/24/22  Yes Tomi Bamberger, PA-C    Family History History reviewed. No pertinent family history.  Social History Social History   Tobacco Use   Smoking status: Never   Smokeless tobacco: Never  Vaping Use   Vaping status: Never Used  Substance Use Topics   Alcohol use: No   Drug use: No     Allergies   Patient has no known allergies.   Review of Systems Review of Systems   Constitutional:  Positive for fatigue. Negative for chills and fever.  Eyes:  Negative for discharge and redness.  Respiratory:  Negative for shortness of breath.   Cardiovascular:  Negative for chest pain.  Gastrointestinal:  Negative for blood in stool, nausea and vomiting.  Neurological:  Positive for light-headedness and headaches. Negative for weakness and numbness.     Physical Exam Triage Vital Signs ED Triage Vitals  Encounter Vitals Group     BP      Systolic BP Percentile      Diastolic BP Percentile      Pulse      Resp      Temp      Temp src      SpO2      Weight      Height      Head Circumference      Peak Flow      Pain Score      Pain Loc      Pain Education      Exclude from Growth Chart    No data found.  Updated  Vital Signs BP (!) 162/96 (BP Location: Right Arm)   Pulse 80   Temp 99.2 F (37.3 C) (Oral)   Resp 18   Ht 5\' 5"  (1.651 m)   Wt 177 lb 14.6 oz (80.7 kg)   LMP 09/16/2022 (Exact Date)   SpO2 99%   BMI 29.61 kg/m      Physical Exam Vitals and nursing note reviewed.  Constitutional:      General: She is not in acute distress.    Appearance: Normal appearance. She is not ill-appearing.  HENT:     Head: Normocephalic and atraumatic.  Eyes:     Conjunctiva/sclera: Conjunctivae normal.  Cardiovascular:     Rate and Rhythm: Normal rate and regular rhythm.  Pulmonary:     Effort: Pulmonary effort is normal. No respiratory distress.     Breath sounds: Normal breath sounds. No wheezing, rhonchi or rales.  Neurological:     Mental Status: She is alert.  Psychiatric:        Mood and Affect: Mood normal.        Behavior: Behavior normal.        Thought Content: Thought content normal.      UC Treatments / Results  Labs (all labs ordered are listed, but only abnormal results are displayed) Labs Reviewed  POCT URINALYSIS DIP (MANUAL ENTRY) - Abnormal; Notable for the following components:      Result Value   pH, UA 8.5 (*)     Protein Ur, POC =30 (*)    All other components within normal limits  COMPREHENSIVE METABOLIC PANEL  CBC WITH DIFFERENTIAL/PLATELET  TSH  POCT URINE PREGNANCY    EKG   Radiology No results found.  Procedures Procedures (including critical care time)  Medications Ordered in UC Medications - No data to display  Initial Impression / Assessment and Plan / UC Course  I have reviewed the triage vital signs and the nursing notes.  Pertinent labs & imaging results that were available during my care of the patient were reviewed by me and considered in my medical decision making (see chart for details).    Unclear etiology of symptoms.  Will order labs for further evaluation.  Urine pregnancy test negative in office today.  UA without signs of infection.  Will start amlodipine for essential hypertension but recommended follow-up with any further concerns while awaiting appointment with PCP.  Final Clinical Impressions(s) / UC Diagnoses   Final diagnoses:  Other fatigue  Irregular menses  Essential hypertension   Discharge Instructions   None    ED Prescriptions     Medication Sig Dispense Auth. Provider   amLODipine (NORVASC) 5 MG tablet Take 1 tablet (5 mg total) by mouth daily. 60 tablet Tomi Bamberger, PA-C      PDMP not reviewed this encounter.   Tomi Bamberger, PA-C 09/24/22 1145

## 2022-09-24 NOTE — ED Triage Notes (Signed)
Due to language barrier, an interpreter was present during the history-taking and subsequent discussion (and for part of the physical exam) with this patient. Courtney Hodge. Number: [id number]  I have been feeling bad for a long time.  I have a new patient appointment with [facility] 11/30/2022.  They advised me to come to your office since I am feeling so bad and have to wait to see the doctor there. - Entered by patient  Additional details: "I don't get my period very well, this month I got only got it one day. I am having headaches often". No dysuria. No nausea vomiting. Stomach "problem at times".

## 2022-09-25 LAB — COMPREHENSIVE METABOLIC PANEL WITH GFR
ALT: 11 IU/L (ref 0–32)
AST: 17 IU/L (ref 0–40)
Albumin: 4.7 g/dL (ref 3.9–4.9)
Alkaline Phosphatase: 60 IU/L (ref 44–121)
BUN/Creatinine Ratio: 8 — ABNORMAL LOW (ref 9–23)
BUN: 6 mg/dL (ref 6–20)
Bilirubin Total: 0.6 mg/dL (ref 0.0–1.2)
CO2: 22 mmol/L (ref 20–29)
Calcium: 10.1 mg/dL (ref 8.7–10.2)
Chloride: 103 mmol/L (ref 96–106)
Creatinine, Ser: 0.73 mg/dL (ref 0.57–1.00)
Globulin, Total: 2.7 g/dL (ref 1.5–4.5)
Glucose: 91 mg/dL (ref 70–99)
Potassium: 3.9 mmol/L (ref 3.5–5.2)
Sodium: 139 mmol/L (ref 134–144)
Total Protein: 7.4 g/dL (ref 6.0–8.5)
eGFR: 112 mL/min/{1.73_m2} (ref >59)

## 2022-09-25 LAB — CBC WITH DIFFERENTIAL/PLATELET
Basophils Absolute: 0 10*3/uL (ref 0.0–0.2)
Basos: 0 %
EOS (ABSOLUTE): 0.1 10*3/uL (ref 0.0–0.4)
Eos: 1 %
Hematocrit: 43.1 % (ref 34.0–46.6)
Hemoglobin: 14.6 g/dL (ref 11.1–15.9)
Immature Grans (Abs): 0 10*3/uL (ref 0.0–0.1)
Immature Granulocytes: 0 %
Lymphocytes Absolute: 2.5 10*3/uL (ref 0.7–3.1)
Lymphs: 52 %
MCH: 31 pg (ref 26.6–33.0)
MCHC: 33.9 g/dL (ref 31.5–35.7)
MCV: 92 fL (ref 79–97)
Monocytes Absolute: 0.5 10*3/uL (ref 0.1–0.9)
Monocytes: 10 %
Neutrophils Absolute: 1.8 10*3/uL (ref 1.4–7.0)
Neutrophils: 37 %
Platelets: 254 10*3/uL (ref 150–450)
RBC: 4.71 x10E6/uL (ref 3.77–5.28)
RDW: 12.9 % (ref 11.7–15.4)
WBC: 4.8 10*3/uL (ref 3.4–10.8)

## 2022-09-25 LAB — TSH: TSH: 2.34 u[IU]/mL (ref 0.450–4.500)

## 2022-11-30 ENCOUNTER — Ambulatory Visit: Payer: Medicaid Other | Admitting: Family Medicine

## 2022-12-12 ENCOUNTER — Ambulatory Visit: Payer: Self-pay | Admitting: *Deleted

## 2022-12-12 NOTE — Telephone Encounter (Signed)
Interpreter Rosemary ID # [id number] Chief Complaint: low abdominal pain radiating to vagina  Symptoms: low abdominal pain comes and goes. Radiates to vagina. Irregular bleeding light only lasting 1 day. Reports pain with intercourse. Takes ibuprofen with some relief. Does cause issues with sleeping. Interferes with work. Frequency: greater than a month  Pertinent Negatives: Patient denies fever no difficulty urination Disposition: [] ED /[] Urgent Care (no appt availability in office) / [] Appointment(In office/virtual)/ []  [facility]/ [] Home Care/ [] Refused Recommended Disposition /[x] [facility]/ []  Follow-up with PCP Additional Notes:   Recommended mobile bus tomorrow . Gave address and patient has [facility] patient app[facility]. Recommended if pain worsens go to ED.       Reason for Disposition  [1] MODERATE pain (e.g., interferes with normal activities) AND [2] pain comes and goes (cramps) AND [3] present > 24 hours  (Exception: Pain with Vomiting or Diarrhea - see that Guideline.)  Answer Assessment - Initial Assessment Questions 1. LOCATION: "Where does it hurt?"      Low abdominal pain  2. RADIATION: "Does the pain shoot anywhere else?" (e.g., chest, back)     vagina 3. ONSET: "When did the pain begin?" (e.g., minutes, hours or days ago)      Greater than 1  month 4. SUDDEN: "Gradual or sudden onset?"     Comes and goes  5. PATTERN "Does the pain come and go, or is it constant?"    - If it comes and goes: "How long does it last?" "Do you have pain now?"     (Note: Comes and goes means the pain is intermittent. It goes away completely between bouts.)    - If constant: "Is it getting better, staying the same, or getting worse?"      (Note: Constant means the pain never goes away completely; most serious pain is constant and gets worse.)      Comes and goes  6. SEVERITY: "How bad is the pain?"  (e.g., Scale 1-10; mild, moderate, or severe)    - MILD (1-3): Doesn't  interfere with normal activities, abdomen soft and not tender to touch.     - MODERATE (4-7): Interferes with normal activities or awakens from sleep, abdomen tender to touch.     - SEVERE (8-10): Excruciating pain, doubled over, unable to do any normal activities.       Pain level 7 . Interferes with sleep and while working  7. RECURRENT SYMPTOM: "Have you ever had this type of stomach pain before?" If Yes, ask: "When was the last time?" and "What happened that time?"      Na  8. CAUSE: "What do you think is causing the stomach pain?"     Na  9. RELIEVING/AGGRAVATING FACTORS: "What makes it better or worse?" (e.g., antacids, bending or twisting motion, bowel movement)     Ibuprofen with some relief  10. OTHER SYMPTOMS: "Do you have any other symptoms?" (e.g., back pain, diarrhea, fever, urination pain, vomiting)       Low abdominal pain  11. PREGNANCY: "Is there any chance you are pregnant?" "When was your last menstrual period?"       Light bleeding pain with intercourse  Protocols used: Abdominal Pain - [facility]

## 2023-01-30 ENCOUNTER — Encounter: Payer: Self-pay | Admitting: Internal Medicine

## 2023-01-30 ENCOUNTER — Ambulatory Visit: Payer: Medicaid Other | Attending: Internal Medicine | Admitting: Internal Medicine

## 2023-01-30 VITALS — BP 146/91 | HR 81 | Temp 98.1°F | Ht 65.0 in | Wt 179.0 lb

## 2023-01-30 DIAGNOSIS — Z23 Encounter for immunization: Secondary | ICD-10-CM

## 2023-01-30 DIAGNOSIS — Z319 Encounter for procreative management, unspecified: Secondary | ICD-10-CM

## 2023-01-30 DIAGNOSIS — Z1331 Encounter for screening for depression: Secondary | ICD-10-CM | POA: Diagnosis not present

## 2023-01-30 DIAGNOSIS — Z2821 Immunization not carried out because of patient refusal: Secondary | ICD-10-CM

## 2023-01-30 DIAGNOSIS — I10 Essential (primary) hypertension: Secondary | ICD-10-CM

## 2023-01-30 DIAGNOSIS — Z7689 Persons encountering health services in other specified circumstances: Secondary | ICD-10-CM | POA: Diagnosis not present

## 2023-01-30 MED ORDER — AMLODIPINE BESYLATE 10 MG PO TABS
10.0000 mg | ORAL_TABLET | Freq: Every day | ORAL | 1 refills | Status: DC
Start: 2023-01-30 — End: 2023-06-05

## 2023-01-30 NOTE — Progress Notes (Signed)
Patient ID: Courtney Hodge, female    DOB: 1990-04-08  MRN: [id number]  CC: Establish Care (Est care. /Infertility concerns/No to flu vax. )   Subjective: Courtney Hodge is a 32 y.o. female who presents for new pt visit.  Pt is from [place] and speak Swahili/French.  Courtney Hodge, from [facility], is with her and interprets. Her concerns today include:  Pt with hx of HTN  Pt here to est care.   Gives PMH of HTN since May 2024; taking BP med but does not recall the name.  I see Norvasc 5 mg daily in the system.  Reports se takes med at nights and did take last night. Checks BP about 4x/wk.  Gives 130-142/80s Tries to limit salt in the foods  Major concern today is infertility.   Has 4 children with age ranges 60-6 yrs old.  Has been trying to get pregnant for the past 3 yrs.  Thinks menses irregular and lite.  Last 3 cycles were Oct 7-8, Nov 6-7 and Dec 1-03/2022.  Not on any method BC.  Saw gynecology 1 year ago and had negative pelvic ultrasound.  Dep screen positive.  Pt states she is not depress just wanting to get pregnant Patient Active Problem List   Diagnosis Date Noted   Irregular menses 12/22/2021   Language barrier, cultural differences 02/02/2016     No current outpatient medications on file prior to visit.   No current facility-administered medications on file prior to visit.    No Known Allergies  Social History   Socioeconomic History   Marital status: Married    Spouse name: Not on file   Number of children: 4   Years of education: Not on file   Highest education level: Not on file  Occupational History   Occupation: works at a [employer], [employer]  Tobacco Use   Smoking status: Never   Smokeless tobacco: Never  Vaping Use   Vaping status: Never Used  Substance and Sexual Activity   Alcohol use: No   Drug use: No   Sexual activity: Yes    Partners: Male    Birth control/protection: None  Other Topics Concern   Not on file  Social History Narrative   ** Merged History  Encounter **       From [place], does not speak English   Social Determinants of Health   Financial Resource Strain: Not on file  Food Insecurity: Not on file  Transportation Needs: Not on file  Physical Activity: Not on file  Stress: Not on file  Social Connections: Not on file  Intimate Partner Violence: Not on file    No family history on file.  Past Surgical History:  Procedure Laterality Date   NO PAST SURGERIES      ROS: Review of Systems Negative except as stated above  PHYSICAL EXAM: BP (!) 146/91   Pulse 81   Temp 98.1 F (36.7 C) (Oral)   Ht 5\' 5"  (1.651 m)   Wt 179 lb (81.2 kg)   SpO2 100%   BMI 29.79 kg/m   Physical Exam  General appearance - alert, well appearing, young appearing African female and in no distress.  She has acne on the face. Mental status - normal mood, behavior, speech, dress, motor activity, and thought processes Neck - supple, no significant adenopathy Chest - clear to auscultation, no wheezes, rales or rhonchi, symmetric air entry Heart - normal rate, regular rhythm, normal S1, S2, no murmurs, rubs, clicks or gallops Extremities -  peripheral pulses normal, no pedal edema, no clubbing or cyanosis      Latest Ref Rng & Units 09/24/2022   10:33 AM 05/26/2021   11:28 AM 04/09/2020   10:55 AM  CMP  Glucose 70 - 99 mg/dL 91  841  99   BUN 6 - 20 mg/dL 6  9  <5   Creatinine 3.24 - 1.00 mg/dL 4.01  0.27  2.53   Sodium 134 - 144 mmol/L 139  140  138   Potassium 3.5 - 5.2 mmol/L 3.9  4.2  3.3   Chloride 96 - 106 mmol/L 103  105  106   CO2 20 - 29 mmol/L 22  20  23    Calcium 8.7 - 10.2 mg/dL 66.4  9.6  9.2   Total Protein 6.0 - 8.5 g/dL 7.4  6.7    Total Bilirubin 0.0 - 1.2 mg/dL 0.6  0.3    Alkaline Phos 44 - 121 IU/L 60  57    AST 0 - 40 IU/L 17  13    ALT 0 - 32 IU/L 11  10     Lipid Panel  No results found for: "CHOL", "TRIG", "HDL", "CHOLHDL", "VLDL", "LDLCALC", "LDLDIRECT"  CBC    Component Value Date/Time   WBC 4.8  09/24/2022 1033   WBC 4.9 04/09/2020 1055   RBC 4.71 09/24/2022 1033   RBC 4.21 04/09/2020 1055   HGB 14.6 09/24/2022 1033   HCT 43.1 09/24/2022 1033   PLT 254 09/24/2022 1033   MCV 92 09/24/2022 1033   MCH 31.0 09/24/2022 1033   MCH 30.6 04/09/2020 1055   MCHC 33.9 09/24/2022 1033   MCHC 35.1 04/09/2020 1055   RDW 12.9 09/24/2022 1033   LYMPHSABS 2.5 09/24/2022 1033   MONOABS 0.6 03/19/2020 1224   EOSABS 0.1 09/24/2022 1033   BASOSABS 0.0 09/24/2022 1033    ASSESSMENT AND PLAN:  1. Encounter to establish care   2. Infertility management -We will check some hormone levels on her.  If hormone levels are good and menses are regular which they have been, she may have to look into having her spouse have a sperm count.  Patient has had for live births in the past. - TSH+T4F+T3Free - Prolactin - FSH/LH - Ambulatory referral to Gynecology  3. Essential hypertension Not at goal but repeat blood pressure better.  Increase Norvasc to 10 mg daily.  Follow-up with clinical pharmacist in 2 weeks for recheck.  DASH diet discussed and encouraged. - amLODipine (NORVASC) 10 MG tablet; Take 1 tablet (10 mg total) by mouth daily.  Dispense: 90 tablet; Refill: 1  4. Positive depression screening Patient reports this is not a major issue for her.  Her main concern is wanting to get pregnant  5. Decline influenza vaccination    Patient was given the opportunity to ask questions.  Patient verbalized understanding of the plan and was able to repeat key elements of the plan.   This documentation was completed using Paediatric nurse.  Any transcriptional errors are unintentional.  Orders Placed This Encounter  Procedures   TSH+T4F+T3Free   Prolactin   FSH/LH   Ambulatory referral to Gynecology     Requested Prescriptions   Signed Prescriptions Disp Refills   amLODipine (NORVASC) 10 MG tablet 90 tablet 1    Sig: Take 1 tablet (10 mg total) by mouth daily.     Return in about 4 months (around 05/31/2023) for Appt with [facility] in 2 wks for BP check.  Jonah Blue, MD, FACP

## 2023-01-30 NOTE — Patient Instructions (Signed)
Increase Amlodipine to 10 mg daily.  DASH Eating Plan DASH stands for Dietary Approaches to Stop Hypertension. The DASH eating plan is a healthy eating plan that has been shown to: Lower high blood pressure (hypertension). Reduce your risk for type 2 diabetes, heart disease, and stroke. Help with weight loss. What are tips for following this plan? Reading food labels Check food labels for the amount of salt (sodium) per serving. Choose foods with less than 5 percent of the Daily Value (DV) of sodium. In general, foods with less than 300 milligrams (mg) of sodium per serving fit into this eating plan. To find whole grains, look for the word "whole" as the first word in the ingredient list. Shopping Buy products labeled as "low-sodium" or "no salt added." Buy fresh foods. Avoid canned foods and pre-made or frozen meals. Cooking Try not to add salt when you cook. Use salt-free seasonings or herbs instead of table salt or sea salt. Check with your health care provider or pharmacist before using salt substitutes. Do not fry foods. Cook foods in healthy ways, such as baking, boiling, grilling, roasting, or broiling. Cook using oils that are good for your heart. These include olive, canola, avocado, soybean, and sunflower oil. Meal planning  Eat a balanced diet. This should include: 4 or more servings of fruits and 4 or more servings of vegetables each day. Try to fill half of your plate with fruits and vegetables. 6-8 servings of whole grains each day. 6 or less servings of lean meat, poultry, or fish each day. 1 oz is 1 serving. A 3 oz (85 g) serving of meat is about the same size as the palm of your hand. One egg is 1 oz (28 g). 2-3 servings of low-fat dairy each day. One serving is 1 cup (237 mL). 1 serving of nuts, seeds, or beans 5 times each week. 2-3 servings of heart-healthy fats. Healthy fats called omega-3 fatty acids are found in foods such as walnuts, flaxseeds, fortified milks, and  eggs. These fats are also found in cold-water fish, such as sardines, salmon, and mackerel. Limit how much you eat of: Canned or prepackaged foods. Food that is high in trans fat, such as fried foods. Food that is high in saturated fat, such as fatty meat. Desserts and other sweets, sugary drinks, and other foods with added sugar. Full-fat dairy products. Do not salt foods before eating. Do not eat more than 4 egg yolks a week. Try to eat at least 2 vegetarian meals a week. Eat more home-cooked food and less restaurant, buffet, and fast food. Lifestyle When eating at a restaurant, ask if your food can be made with less salt or no salt. If you drink alcohol: Limit how much you have to: 0-1 drink a day if you are female. 0-2 drinks a day if you are female. Know how much alcohol is in your drink. In the [place] one drink is one 12 oz bottle of beer (355 mL), one 5 oz glass of wine (148 mL), or one 1 oz glass of hard liquor (44 mL). General information Avoid eating more than 2,300 mg of salt a day. If you have hypertension, you may need to reduce your sodium intake to 1,500 mg a day. Work with your provider to stay at a healthy body weight or lose weight. Ask what the best weight range is for you. On most days of the week, get at least 30 minutes of exercise that causes your heart to beat  faster. This may include walking, swimming, or biking. Work with your provider or dietitian to adjust your eating plan to meet your specific calorie needs. What foods should I eat? Fruits All fresh, dried, or frozen fruit. Canned fruits that are in their natural juice and do not have sugar added to them. Vegetables Fresh or frozen vegetables that are raw, steamed, roasted, or grilled. Low-sodium or reduced-sodium tomato and vegetable juice. Low-sodium or reduced-sodium tomato sauce and tomato paste. Low-sodium or reduced-sodium canned vegetables. Grains Whole-grain or whole-wheat bread. Whole-grain or  whole-wheat pasta. Brown rice. Orpah Cobb. Bulgur. Whole-grain and low-sodium cereals. Pita bread. Low-fat, low-sodium crackers. Whole-wheat flour tortillas. Meats and other proteins Skinless chicken or [place]. Ground chicken or [place]. Pork with fat trimmed off. Fish and seafood. Egg whites. Dried beans, peas, or lentils. Unsalted nuts, nut butters, and seeds. Unsalted canned beans. Lean cuts of beef with fat trimmed off. Low-sodium, lean precooked or cured meat, such as sausages or meat loaves. Dairy Low-fat (1%) or fat-free (skim) milk. Reduced-fat, low-fat, or fat-free cheeses. Nonfat, low-sodium ricotta or cottage cheese. Low-fat or nonfat yogurt. Low-fat, low-sodium cheese. Fats and oils Soft margarine without trans fats. Vegetable oil. Reduced-fat, low-fat, or light mayonnaise and salad dressings (reduced-sodium). Canola, safflower, olive, avocado, soybean, and sunflower oils. Avocado. Seasonings and condiments Herbs. Spices. Seasoning mixes without salt. Other foods Unsalted popcorn and pretzels. Fat-free sweets. The items listed above may not be all the foods and drinks you can have. Talk to a dietitian to learn more. What foods should I avoid? Fruits Canned fruit in a light or heavy syrup. Fried fruit. Fruit in cream or butter sauce. Vegetables Creamed or fried vegetables. Vegetables in a cheese sauce. Regular canned vegetables that are not marked as low-sodium or reduced-sodium. Regular canned tomato sauce and paste that are not marked as low-sodium or reduced-sodium. Regular tomato and vegetable juices that are not marked as low-sodium or reduced-sodium. Rosita Fire. Olives. Grains Baked goods made with fat, such as croissants, muffins, or some breads. Dry pasta or rice meal packs. Meats and other proteins Fatty cuts of meat. Ribs. Fried meat. Tomasa Blase. Bologna, salami, and other precooked or cured meats, such as sausages or meat loaves, that are not lean and low in sodium. Fat from the  back of a pig (fatback). Bratwurst. Salted nuts and seeds. Canned beans with added salt. Canned or smoked fish. Whole eggs or egg yolks. Chicken or [place] with skin. Dairy Whole or 2% milk, cream, and half-and-half. Whole or full-fat cream cheese. Whole-fat or sweetened yogurt. Full-fat cheese. Nondairy creamers. Whipped toppings. Processed cheese and cheese spreads. Fats and oils Butter. Stick margarine. Lard. Shortening. Ghee. Bacon fat. Tropical oils, such as coconut, palm kernel, or palm oil. Seasonings and condiments Onion salt, garlic salt, seasoned salt, table salt, and sea salt. Worcestershire sauce. Tartar sauce. Barbecue sauce. Teriyaki sauce. Soy sauce, including reduced-sodium soy sauce. Steak sauce. Canned and packaged gravies. Fish sauce. Oyster sauce. Cocktail sauce. Store-bought horseradish. Ketchup. Mustard. Meat flavorings and tenderizers. Bouillon cubes. Hot sauces. Pre-made or packaged marinades. Pre-made or packaged taco seasonings. Relishes. Regular salad dressings. Other foods Salted popcorn and pretzels. The items listed above may not be all the foods and drinks you should avoid. Talk to a dietitian to learn more. Where to find more information National Heart, Lung, and Blood Institute (NHLBI): BuffaloDryCleaner.gl American Heart Association (AHA): heart.org Academy of Nutrition and Dietetics: eatright.org National Kidney Foundation (NKF): kidney.org This information is not intended to replace advice given to you  by your health care provider. Make sure you discuss any questions you have with your health care provider. Document Revised: 02/24/2022 Document Reviewed: 02/24/2022 Elsevier Patient Education  2024 [employer].

## 2023-01-31 LAB — FSH/LH
FSH: 3.4 m[IU]/mL
LH: 5.7 m[IU]/mL

## 2023-01-31 LAB — TSH+T4F+T3FREE
Free T4: 1.14 ng/dL (ref 0.82–1.77)
T3, Free: 3.2 pg/mL (ref 2.0–4.4)
TSH: 2.18 u[IU]/mL (ref 0.450–4.500)

## 2023-01-31 LAB — PROLACTIN: Prolactin: 7.2 ng/mL (ref 4.8–33.4)

## 2023-02-20 ENCOUNTER — Encounter: Payer: Self-pay | Admitting: Pharmacist

## 2023-02-20 ENCOUNTER — Ambulatory Visit: Payer: Medicaid Other | Attending: Internal Medicine | Admitting: Pharmacist

## 2023-02-20 VITALS — BP 150/93 | HR 74

## 2023-02-20 DIAGNOSIS — I10 Essential (primary) hypertension: Secondary | ICD-10-CM

## 2023-02-20 MED ORDER — HYDROCHLOROTHIAZIDE 25 MG PO TABS: 25.0000 mg | ORAL_TABLET | Freq: Every day | ORAL | 1 refills | Status: DC

## 2023-02-20 NOTE — Progress Notes (Cosign Needed)
   S:     No chief complaint on file.  32 y.o. female who presents for hypertension evaluation, education, and management.  PMH is significant for HTN.  Patient was referred and last seen by Primary Care Provider, Dr. Laural Benes, on 01/30/2023.  At last visit, BP was 146/91 mmHg. Dr. Laural Benes increased amlodipine dose to 10 mg daily.   Today, patient arrives in good spirits and presents without assistance. Denies dizziness, headache, blurred vision, swelling. Patient reports hypertension is longstanding.   Family/Social history:  Fhx: no pertinent positives  Tobacco: never smoker Alcohol: none reported  Medication adherence reported. Patient has taken BP medications today.   Current antihypertensives include: amlodipine 10 mg daily   Antihypertensives tried in the past include: none other amlodipine  Reported home BP readings: none reported  Patient reported dietary habits:  -Sodium: has cut back on her sodium  -Caffeine: does drink soda daily   Patient-reported exercise habits: none reported  O:  Vitals:   02/20/23 1627  BP: (!) 150/93  Pulse: 74     Last 3 Office BP readings: BP Readings from Last 3 Encounters:  02/20/23 (!) 150/93  01/30/23 (!) 146/91  09/24/22 (!) 162/96    BMET    Component Value Date/Time   NA 139 09/24/2022 1033   K 3.9 09/24/2022 1033   CL 103 09/24/2022 1033   CO2 22 09/24/2022 1033   GLUCOSE 91 09/24/2022 1033   GLUCOSE 99 04/09/2020 1055   BUN 6 09/24/2022 1033   CREATININE 0.73 09/24/2022 1033   CALCIUM 10.1 09/24/2022 1033   GFRNONAA >60 04/09/2020 1055   GFRAA >60 05/06/2016 0046    Renal function: CrCl cannot be calculated (Patient's most recent lab result is older than the maximum 21 days allowed.).  Clinical ASCVD: No  The ASCVD Risk score (Arnett DK, et al., 2019) failed to calculate for the following reasons:   The 2019 ASCVD risk score is only valid for ages 48 to 77  Patient is participating in a Managed Medicaid  Plan: No    A/P: Hypertension diagnosed currently uncontrolled on current medications. BP goal < 130/80 mmHg. Medication adherence appears optimal. I reviewed her previous PCP notes and it looks like she is planning to become pregnant. She is not currently pregnant and verbalizes understanding to let us know if she gets a positive home result. I recommend to avoid RAAS agents completely d/t child-bearing age. I will have her start hydrochlorothiazide instead and return in 1 month for BP recheck and labs.  -Continued amlodipine 10 mg daily.  -Started hydrochlorothiazide 25 mg daily.  -Patient educated on purpose, proper use, and potential adverse effects of HCTZ.  -F/u labs ordered - anticipate BMP in 1 month. -Counseled on lifestyle modifications for blood pressure control including reduced dietary sodium, increased exercise, adequate sleep. -Encouraged patient to check BP at home and bring log of readings to next visit. Counseled on proper use of home BP cuff.   Results reviewed and written information provided.    Written patient instructions provided. Patient verbalized understanding of treatment plan.  Total time in face to face counseling 20 minutes.    Follow-up:  Pharmacist in 1 month.  Butch Penny, PharmD, Patsy [place], CPP Clinical Pharmacist [facility] [phone]

## 2023-03-23 NOTE — Progress Notes (Deleted)
   S:     No chief complaint on file.  33 y.o. female who presents for hypertension evaluation, education, and management.  PMH is significant for HTN. Patient was referred and last seen by Primary Care Provider, Dr. Laural Benes, on 01/30/23. At last visit, with pharmacy on 02/20/23 BP was 150/93, despite the patient increasing amlodipine from 5 to 10 mg PO daily as previously instructed by Dr. Laural Benes. Hydrochlorothiazide 25 mg PO daily was added. Patient is planning to become pregnant, and therefore RAAS agents should be avoided.   Today, patient arrives in *** spirits and presents without *** assistance. *** Denies dizziness, headache, blurred vision, swelling.   Patient reports hypertension is longstanding.   Family/Social history: *** No family history on file. Tobacco: never smoker Alcohol: none reported  Medication adherence *** . Patient has *** taken BP medications today.   Current antihypertensives include: amlodipine 10 mg PO daily, hydrochlorothiazide 25 mg PO daily  Antihypertensives tried in the past include: none  Reported home BP readings: ***  Patient reported dietary habits: Eats *** meals/day Breakfast: *** Lunch: *** Dinner: *** Snacks: *** Drinks: *** -Sodium: has cut back on her sodium  -Caffeine: does drink soda daily  Patient-reported exercise habits: ***  ASCVD risk factors include: ***  O:  ROS  Physical Exam  Last 3 Office BP readings: BP Readings from Last 3 Encounters:  02/20/23 (!) 150/93  01/30/23 (!) 146/91  09/24/22 (!) 162/96    BMET    Component Value Date/Time   NA 139 09/24/2022 1033   K 3.9 09/24/2022 1033   CL 103 09/24/2022 1033   CO2 22 09/24/2022 1033   GLUCOSE 91 09/24/2022 1033   GLUCOSE 99 04/09/2020 1055   BUN 6 09/24/2022 1033   CREATININE 0.73 09/24/2022 1033   CALCIUM 10.1 09/24/2022 1033   GFRNONAA >60 04/09/2020 1055   GFRAA >60 05/06/2016 0046    Renal function: CrCl cannot be calculated (Patient's most  recent lab result is older than the maximum 21 days allowed.).  Clinical ASCVD: No  The ASCVD Risk score (Arnett DK, et al., 2019) failed to calculate for the following reasons:   The 2019 ASCVD risk score is only valid for ages 66 to 27  Patient is participating in a Managed Medicaid Plan:  No  F/u BMP today after starting hydrochlorothiazide > counsel that this should be discontinued if she becomes pregnant Next line agent? Potentially labetalol if she is planning to become pregnant - 100 mg PO BID Home BP cuff? Can send one in via medicaid/[facility] F/u caffeine intake  A/P: Hypertension diagnosed and currently *** on current medications. BP goal < 130/80 *** mmHg. Medication adherence appears ***. Control is suboptimal due to ***.  -{Meds adjust:18428} ***.  -{Meds adjust:18428} ***.  -Patient educated on purpose, proper use, and potential adverse effects of ***.  -F/u labs ordered - *** -Counseled on lifestyle modifications for blood pressure control including reduced dietary sodium, increased exercise, adequate sleep. -Encouraged patient to check BP at home and bring log of readings to next visit. Counseled on proper use of home BP cuff.   Results reviewed and written information provided.    Written patient instructions provided. Patient verbalized understanding of treatment plan.  Total time in face to face counseling *** minutes.    Follow-up:  Pharmacist ***. PCP clinic visit in 06/05/23.  Patient seen with ***  Nils Pyle, PharmD PGY1 Pharmacy Resident

## 2023-03-27 ENCOUNTER — Ambulatory Visit: Payer: Medicaid Other | Admitting: Pharmacist

## 2023-03-28 ENCOUNTER — Ambulatory Visit: Payer: Medicaid Other | Admitting: Obstetrics and Gynecology

## 2023-06-05 ENCOUNTER — Ambulatory Visit: Payer: Medicaid Other | Attending: Internal Medicine | Admitting: Internal Medicine

## 2023-06-05 VITALS — BP 136/79 | HR 100 | Temp 98.3°F | Ht 65.0 in | Wt 180.0 lb

## 2023-06-05 DIAGNOSIS — Z3201 Encounter for pregnancy test, result positive: Secondary | ICD-10-CM | POA: Diagnosis not present

## 2023-06-05 DIAGNOSIS — I10 Essential (primary) hypertension: Secondary | ICD-10-CM

## 2023-06-05 DIAGNOSIS — N911 Secondary amenorrhea: Secondary | ICD-10-CM

## 2023-06-05 LAB — POCT URINE PREGNANCY: Preg Test, Ur: POSITIVE — AB

## 2023-06-05 MED ORDER — LABETALOL HCL 100 MG PO TABS
50.0000 mg | ORAL_TABLET | Freq: Two times a day (BID) | ORAL | 1 refills | Status: DC
Start: 2023-06-05 — End: 2023-08-03

## 2023-06-05 MED ORDER — PRENATAL VITAMIN PLUS LOW IRON 27-1 MG PO TABS
ORAL_TABLET | ORAL | 0 refills | Status: DC
Start: 2023-06-05 — End: 2023-08-03

## 2023-06-05 NOTE — Patient Instructions (Signed)
 You have been referred to OB/GYN.  You will be called with an appointment. Stop the other blood pressure medications you were taking.  I have sent a prescription for a blood pressure medication called Labetalol for you to take twice a day.

## 2023-06-05 NOTE — Progress Notes (Signed)
 Patient ID: Courtney Hodge, female    DOB: 12-11-1990  MRN: [id number]  CC: Follow-up (Follow-up. /Irregular menses - last cycle 2 mo ago/Reports stopping meds 2 mo ago - suspects this is causing irregular menses)   Subjective: Courtney Hodge is a 33 y.o. female who presents for chronic ds management.  Husband is with her. Her concerns today include:  Pt with hx of HTN   [employer] Language interpreter used during this encounter. #Nimo [id number]  01/30/2023:   Major concern today is infertility.   Has 4 children with age ranges 68-6 yrs old.  Has been trying to get pregnant for the past 3 yrs.  Thinks menses irregular and lite.  Last 3 cycles were Oct 7-8, Nov 6-7 and Dec 1-03/2022.  Not on any method BC.  Saw gynecology 1 year ago and had negative pelvic ultrasound. -hormone leves were checked on that visit and were normal -pt was left on Norvasc 10 mg daily for BP.  Saw CPP and hydrochlorothiazide was added.  Today: Pt stopped the Norvasc and hydrochlorothiazide 02/2023 because she missed her menses Last menses was 1/23-25/2025 No menses for Feb, March Did home pregnancy test in Feb and reports it had 2 lines; was not sure it was positive.  Endorses breast tenderness.  So nausea at times.  Pt does not smoke and does not drink ETOH beverages.    Patient Active Problem List   Diagnosis Date Noted   Irregular menses 12/22/2021   Language barrier, cultural differences 02/02/2016     No current outpatient medications on file prior to visit.   No current facility-administered medications on file prior to visit.    No Known Allergies  Social History   Socioeconomic History   Marital status: Married    Spouse name: Not on file   Number of children: 4   Years of education: Not on file   Highest education level: Not on file  Occupational History   Occupation: works at a [employer], quantity  Tobacco Use   Smoking status: Never   Smokeless tobacco: Never  Vaping Use   Vaping status:  Never Used  Substance and Sexual Activity   Alcohol use: No   Drug use: No   Sexual activity: Yes    Partners: Male    Birth control/protection: None  Other Topics Concern   Not on file  Social History Narrative   ** Merged History Encounter **       From [employer], does not speak English   Social Drivers of Corporate investment banker Strain: Not on file  Food Insecurity: Not on file  Transportation Needs: Not on file  Physical Activity: Not on file  Stress: Not on file  Social Connections: Not on file  Intimate Partner Violence: Not on file    No family history on file.  Past Surgical History:  Procedure Laterality Date   NO PAST SURGERIES      ROS: Review of Systems Negative except as stated above  PHYSICAL EXAM: BP 136/79 (BP Location: Left Arm, Patient Position: Sitting, Cuff Size: Normal)   Pulse 100   Temp 98.3 F (36.8 C) (Oral)   Ht 5\' 5"  (1.651 m)   Wt 180 lb (81.6 kg)   LMP 03/16/2023 (Exact Date)   SpO2 100%   BMI 29.95 kg/m   Physical Exam Repeat BP 148/86 General appearance - alert, well appearing, and in no distress Mental status - normal mood, behavior, speech, dress, motor activity, and  thought processes      Latest Ref Rng & Units 09/24/2022   10:33 AM 05/26/2021   11:28 AM 04/09/2020   10:55 AM  CMP  Glucose 70 - 99 mg/dL 91  409  99   BUN 6 - 20 mg/dL 6  9  <5   Creatinine 8.11 - 1.00 mg/dL 9.14  7.82  9.56   Sodium 134 - 144 mmol/L 139  140  138   Potassium 3.5 - 5.2 mmol/L 3.9  4.2  3.3   Chloride 96 - 106 mmol/L 103  105  106   CO2 20 - 29 mmol/L 22  20  23    Calcium 8.7 - 10.2 mg/dL 21.3  9.6  9.2   Total Protein 6.0 - 8.5 g/dL 7.4  6.7    Total Bilirubin 0.0 - 1.2 mg/dL 0.6  0.3    Alkaline Phos 44 - 121 IU/L 60  57    AST 0 - 40 IU/L 17  13    ALT 0 - 32 IU/L 11  10     Lipid Panel  No results found for: "CHOL", "TRIG", "HDL", "CHOLHDL", "VLDL", "LDLCALC", "LDLDIRECT"  CBC    Component Value Date/Time   WBC 4.8 09/24/2022  1033   WBC 4.9 04/09/2020 1055   RBC 4.71 09/24/2022 1033   RBC 4.21 04/09/2020 1055   HGB 14.6 09/24/2022 1033   HCT 43.1 09/24/2022 1033   PLT 254 09/24/2022 1033   MCV 92 09/24/2022 1033   MCH 31.0 09/24/2022 1033   MCH 30.6 04/09/2020 1055   MCHC 33.9 09/24/2022 1033   MCHC 35.1 04/09/2020 1055   RDW 12.9 09/24/2022 1033   LYMPHSABS 2.5 09/24/2022 1033   MONOABS 0.6 03/19/2020 1224   EOSABS 0.1 09/24/2022 1033   BASOSABS 0.0 09/24/2022 1033   Results for orders placed or performed in visit on 01/30/23  TSH+T4F+T3Free   Collection Time: 01/30/23  9:53 AM  Result Value Ref Range   TSH 2.180 0.450 - 4.500 uIU/mL   T3, Free 3.2 2.0 - 4.4 pg/mL   Free T4 1.14 0.82 - 1.77 ng/dL  Prolactin   Collection Time: 01/30/23  9:53 AM  Result Value Ref Range   Prolactin 7.2 4.8 - 33.4 ng/mL  FSH/LH   Collection Time: 01/30/23  9:53 AM  Result Value Ref Range   LH 5.7 mIU/mL   FSH 3.4 mIU/mL   Results for orders placed or performed in visit on 06/05/23  POCT urine pregnancy   Collection Time: 06/05/23  5:33 PM  Result Value Ref Range   Preg Test, Ur Positive (A) Negative    ASSESSMENT AND PLAN: 1. Secondary amenorrhea (Primary) Point-of-care pregnancy test today is positive - POCT urine pregnancy  2. Positive pregnancy test Patient informed of positive pregnancy test.  She was happy to hear this. Advised to avoid street drugs, alcoholic beverages and nicotine/tobacco in pregnancy. Advised to be seen in the emergency room if she develops any abdominal pain or spotting. Recommend taking daily prenatal vitamin with iron.  Referral submitted to get her in with OB/GYN. - Ambulatory referral to Obstetrics / Gynecology - Prenatal Vit-Fe Fumarate-FA (PRENATAL VITAMIN PLUS LOW IRON) 27-1 MG TABS; 1 tab PO daily.  Dispense: 100 tablet; Refill: 0  3. Essential hypertension Stop Norvasc and hydrochlorothiazide.  Start Labetalol instead. - labetalol (NORMODYNE) 100 MG tablet; Take 0.5  tablets (50 mg total) by mouth 2 (two) times daily.  Dispense: 60 tablet; Refill: 1    Patient was  given the opportunity to ask questions.  Patient verbalized understanding of the plan and was able to repeat key elements of the plan.   This documentation was completed using Paediatric nurse.  Any transcriptional errors are unintentional.  Orders Placed This Encounter  Procedures   Ambulatory referral to Obstetrics / Gynecology   POCT urine pregnancy     Requested Prescriptions   Signed Prescriptions Disp Refills   Prenatal Vit-Fe Fumarate-FA (PRENATAL VITAMIN PLUS LOW IRON) 27-1 MG TABS 100 tablet 0    Sig: 1 tab PO daily.   labetalol (NORMODYNE) 100 MG tablet 60 tablet 1    Sig: Take 0.5 tablets (50 mg total) by mouth 2 (two) times daily.    No follow-ups on file.  Concetta Dee, MD, FACP

## 2023-06-28 ENCOUNTER — Other Ambulatory Visit: Payer: Self-pay | Admitting: *Deleted

## 2023-06-28 ENCOUNTER — Encounter: Payer: Self-pay | Admitting: *Deleted

## 2023-06-29 ENCOUNTER — Other Ambulatory Visit (INDEPENDENT_AMBULATORY_CARE_PROVIDER_SITE_OTHER): Payer: Self-pay

## 2023-06-29 ENCOUNTER — Ambulatory Visit: Admitting: *Deleted

## 2023-06-29 VITALS — BP 132/86 | HR 85 | Wt 186.2 lb

## 2023-06-29 DIAGNOSIS — Z3A15 15 weeks gestation of pregnancy: Secondary | ICD-10-CM

## 2023-06-29 DIAGNOSIS — Z1339 Encounter for screening examination for other mental health and behavioral disorders: Secondary | ICD-10-CM | POA: Diagnosis not present

## 2023-06-29 DIAGNOSIS — O30042 Twin pregnancy, dichorionic/diamniotic, second trimester: Secondary | ICD-10-CM | POA: Diagnosis not present

## 2023-06-29 DIAGNOSIS — Z362 Encounter for other antenatal screening follow-up: Secondary | ICD-10-CM

## 2023-06-29 DIAGNOSIS — O0990 Supervision of high risk pregnancy, unspecified, unspecified trimester: Secondary | ICD-10-CM | POA: Insufficient documentation

## 2023-06-29 NOTE — Progress Notes (Signed)
 New OB Intake  I connected with Courtney Hodge  on 06/29/23 at  2:10 PM EDT by In Person Visit and verified that I am speaking with the correct person using two identifiers. Nurse is located at [facility] and pt is located at [place].  I discussed the limitations, risks, security and privacy concerns of performing an evaluation and management service by telephone and the availability of in person appointments. I also discussed with the patient that there may be a patient responsible charge related to this service. The patient expressed understanding and agreed to proceed.  I explained I am completing New OB Intake today. We discussed EDD of 12/21/2023, by Last Menstrual Period. Pt is [id number]. I reviewed her allergies, medications and Medical/Surgical/OB history.    Patient Active Problem List   Diagnosis Date Noted   Supervision of high risk pregnancy, antepartum 06/29/2023   Irregular menses 12/22/2021   Language barrier, cultural differences 02/02/2016     Concerns addressed today  Delivery Plans Plans to deliver at [facility]. Discussed the nature of our practice with multiple providers including residents and students. Due to the size of the practice, the delivering provider may not be the same as those providing prenatal care.   Patient is not interested in water birth.  MyChart/Babyscripts MyChart access verified. I explained pt will have some visits in office and some virtually. Babyscripts instructions given and order placed. Patient verifies receipt of registration text/e-mail. Account successfully created and app downloaded. If patient is a candidate for Optimized scheduling, add to sticky note.   Blood Pressure Cuff/Weight Scale Blood pressure cuff ordered for patient to pick-up from [employer]. Explained after first prenatal appt pt will check weekly and document in Babyscripts. Patient does not have weight scale; patient may purchase if they desire to track weight weekly in  Babyscripts.  Anatomy US  Explained first scheduled US  will be around 19 weeks. Anatomy US  scheduled for TBD at TBD.  Is patient a candidate for Babyscripts Optimization? No, due to Risk Factors/ Language Barrier   First visit review I reviewed new OB appt with patient. Explained pt will be seen by Dr. April Knack at first visit. Discussed [facility] [place] genetic screening with patient. Requests Panorama and Horizon.. Routine prenatal labs collected at today's visit.   Last Pap Diagnosis  Date Value Ref Range Status  05/26/2021   Final   - Negative for intraepithelial lesion or malignancy (NILM)    Donette Furlong, RN 06/29/2023  2:49 PM

## 2023-06-29 NOTE — Patient Instructions (Signed)
[facility] has a partnership with the [facility] to provide prenatal navigation for the most needed resources in our community. In order to see how we can help connect you to these resources we need consent to contact you. Please complete the very short consent using the link below:   English Link: [email or web address].[email or web address]?[email or web address]  Spanish Link: [email or web address].[email or web address]?[email or web address]  

## 2023-06-30 LAB — CBC/D/PLT+RPR+RH+ABO+RUBIGG...
Antibody Screen: NEGATIVE
Basophils Absolute: 0 10*3/uL (ref 0.0–0.2)
Basos: 0 %
EOS (ABSOLUTE): 0.1 10*3/uL (ref 0.0–0.4)
Eos: 1 %
HCV Ab: NONREACTIVE
HIV Screen 4th Generation wRfx: NONREACTIVE
Hematocrit: 31.6 % — ABNORMAL LOW (ref 34.0–46.6)
Hemoglobin: 11.3 g/dL (ref 11.1–15.9)
Hepatitis B Surface Ag: NEGATIVE
Immature Grans (Abs): 0.1 10*3/uL (ref 0.0–0.1)
Immature Granulocytes: 1 %
Lymphocytes Absolute: 1.7 10*3/uL (ref 0.7–3.1)
Lymphs: 22 %
MCH: 31.9 pg (ref 26.6–33.0)
MCHC: 35.8 g/dL — ABNORMAL HIGH (ref 31.5–35.7)
MCV: 89 fL (ref 79–97)
Monocytes Absolute: 0.6 10*3/uL (ref 0.1–0.9)
Monocytes: 7 %
Neutrophils Absolute: 5.7 10*3/uL (ref 1.4–7.0)
Neutrophils: 69 %
Platelets: 185 10*3/uL (ref 150–450)
RBC: 3.54 x10E6/uL — ABNORMAL LOW (ref 3.77–5.28)
RDW: 12.4 % (ref 11.7–15.4)
RPR Ser Ql: NONREACTIVE
Rh Factor: NEGATIVE
Rubella Antibodies, IGG: 4.07 {index} (ref >0.99)
WBC: 8.1 10*3/uL (ref 3.4–10.8)

## 2023-06-30 LAB — COMPREHENSIVE METABOLIC PANEL WITH GFR
ALT: 22 IU/L (ref 0–32)
AST: 24 IU/L (ref 0–40)
Albumin: 3.7 g/dL — ABNORMAL LOW (ref 3.9–4.9)
Alkaline Phosphatase: 55 IU/L (ref 44–121)
BUN/Creatinine Ratio: 15 (ref 9–23)
BUN: 8 mg/dL (ref 6–20)
Bilirubin Total: <0.2 mg/dL (ref 0.0–1.2)
CO2: 19 mmol/L — ABNORMAL LOW (ref 20–29)
Calcium: 9.1 mg/dL (ref 8.7–10.2)
Chloride: 104 mmol/L (ref 96–106)
Creatinine, Ser: 0.55 mg/dL — ABNORMAL LOW (ref 0.57–1.00)
Globulin, Total: 2.6 g/dL (ref 1.5–4.5)
Glucose: 82 mg/dL (ref 70–99)
Potassium: 3.8 mmol/L (ref 3.5–5.2)
Sodium: 135 mmol/L (ref 134–144)
Total Protein: 6.3 g/dL (ref 6.0–8.5)
eGFR: 124 mL/min/{1.73_m2} (ref >59)

## 2023-06-30 LAB — HCV INTERPRETATION

## 2023-06-30 LAB — HEMOGLOBIN A1C
Est. average glucose Bld gHb Est-mCnc: 100 mg/dL
Hgb A1c MFr Bld: 5.1 % (ref 4.8–5.6)

## 2023-07-04 LAB — CULTURE, OB URINE

## 2023-07-04 LAB — URINE CULTURE, OB REFLEX

## 2023-07-05 LAB — PANORAMA PRENATAL TEST FULL PANEL:PANORAMA TEST PLUS 5 ADDITIONAL MICRODELETIONS
FETAL FRACTION SECOND FETUS: 3.9
FETAL FRACTION: 6.9

## 2023-07-06 ENCOUNTER — Encounter: Payer: Self-pay | Admitting: Obstetrics

## 2023-07-06 ENCOUNTER — Ambulatory Visit (INDEPENDENT_AMBULATORY_CARE_PROVIDER_SITE_OTHER): Admitting: Obstetrics

## 2023-07-06 ENCOUNTER — Telehealth: Payer: Self-pay

## 2023-07-06 VITALS — BP 119/83 | HR 87 | Wt 184.1 lb

## 2023-07-06 DIAGNOSIS — I10 Essential (primary) hypertension: Secondary | ICD-10-CM | POA: Diagnosis not present

## 2023-07-06 DIAGNOSIS — Z603 Acculturation difficulty: Secondary | ICD-10-CM | POA: Diagnosis not present

## 2023-07-06 DIAGNOSIS — O0990 Supervision of high risk pregnancy, unspecified, unspecified trimester: Secondary | ICD-10-CM

## 2023-07-06 NOTE — Progress Notes (Signed)
 Pt presents for new ob. Pt has paperwork to be completed by provider. Pt has no other questions or cocnerns.

## 2023-07-06 NOTE — Telephone Encounter (Signed)
 TC to pt about FMLA paperwork that was left this AM. Need to find out pt's dates that she plans on being out and returning to work, also if she would like forms faxed or to pick them up. No answer.

## 2023-07-06 NOTE — Progress Notes (Signed)
 Subjective:  Courtney Hodge is a 33 y.o. G6P5005 at [redacted]w[redacted]d being seen today for ongoing prenatal care.  She is currently monitored for the following issues for this high-risk pregnancy and has Language barrier, cultural differences; Irregular menses; and Supervision of high risk pregnancy, antepartum on their problem list.  Patient reports no complaints.  Contractions: Not present. Vag. Bleeding: None.  Movement: Present. Denies leaking of fluid.   The following portions of the patient's history were reviewed and updated as appropriate: allergies, current medications, past family history, past medical history, past social history, past surgical history and problem list. Problem list updated.  Objective:   Vitals:   07/06/23 1048  BP: 119/83  Pulse: 87  Weight: 184 lb 1.6 oz (83.5 kg)    Fetal Status: Fetal Heart Rate (bpm): A 148/ B 152   Movement: Present     General:  Alert, oriented and cooperative. Patient is in no acute distress.  Skin: Skin is warm and dry. No rash noted.   Cardiovascular: Normal heart rate noted  Respiratory: Normal respiratory effort, no problems with respiration noted  Abdomen: Soft, gravid, appropriate for gestational age. Pain/Pressure: Absent     Pelvic:  Cervical exam deferred        Extremities: Normal range of motion.  Edema: None  Mental Status: Normal mood and affect. Normal behavior. Normal judgment and thought content.   Urinalysis:      Assessment and Plan:  Pregnancy: G6P5005 at [redacted]w[redacted]d  1. Supervision of high risk pregnancy, antepartum (Primary) Rx: - AFP, Serum, Open Spina Bifida  2. Hypertension, unspecified type - BP clinically stable  3. Language barrier, cultural differences - interpreter present   Preterm labor symptoms and general obstetric precautions including but not limited to vaginal bleeding, contractions, leaking of fluid and fetal movement were reviewed in detail with the patient. Please refer to After Visit Summary for other  counseling recommendations.   Return in about 4 weeks (around 08/03/2023) for [facility].   Gabrielle Joiner, MD 07/06/2023

## 2023-07-08 ENCOUNTER — Inpatient Hospital Stay (HOSPITAL_COMMUNITY)
Admission: AD | Admit: 2023-07-08 | Discharge: 2023-07-08 | Disposition: A | Discharge status: Home / Self Care | Attending: Obstetrics and Gynecology | Admitting: Obstetrics and Gynecology

## 2023-07-08 ENCOUNTER — Other Ambulatory Visit: Payer: Self-pay

## 2023-07-08 DIAGNOSIS — O30002 Twin pregnancy, unspecified number of placenta and unspecified number of amniotic sacs, second trimester: Secondary | ICD-10-CM | POA: Diagnosis not present

## 2023-07-08 DIAGNOSIS — O0990 Supervision of high risk pregnancy, unspecified, unspecified trimester: Secondary | ICD-10-CM

## 2023-07-08 DIAGNOSIS — O26892 Other specified pregnancy related conditions, second trimester: Secondary | ICD-10-CM | POA: Diagnosis not present

## 2023-07-08 DIAGNOSIS — J069 Acute upper respiratory infection, unspecified: Secondary | ICD-10-CM

## 2023-07-08 DIAGNOSIS — O99512 Diseases of the respiratory system complicating pregnancy, second trimester: Secondary | ICD-10-CM | POA: Insufficient documentation

## 2023-07-08 DIAGNOSIS — O0992 Supervision of high risk pregnancy, unspecified, second trimester: Secondary | ICD-10-CM | POA: Insufficient documentation

## 2023-07-08 DIAGNOSIS — R102 Pelvic and perineal pain: Secondary | ICD-10-CM | POA: Diagnosis not present

## 2023-07-08 DIAGNOSIS — N949 Unspecified condition associated with female genital organs and menstrual cycle: Secondary | ICD-10-CM

## 2023-07-08 DIAGNOSIS — Z3A16 16 weeks gestation of pregnancy: Secondary | ICD-10-CM | POA: Diagnosis not present

## 2023-07-08 DIAGNOSIS — R059 Cough, unspecified: Secondary | ICD-10-CM | POA: Diagnosis present

## 2023-07-08 DIAGNOSIS — Z3492 Encounter for supervision of normal pregnancy, unspecified, second trimester: Secondary | ICD-10-CM

## 2023-07-08 LAB — URINALYSIS, ROUTINE W REFLEX MICROSCOPIC
Bilirubin Urine: NEGATIVE
Glucose, UA: NEGATIVE mg/dL
Hgb urine dipstick: NEGATIVE
Ketones, ur: NEGATIVE mg/dL
Leukocytes,Ua: NEGATIVE
Nitrite: NEGATIVE
Protein, ur: NEGATIVE mg/dL
Specific Gravity, Urine: 1.02 (ref 1.005–1.030)
pH: 6 (ref 5.0–8.0)

## 2023-07-08 LAB — AFP, SERUM, OPEN SPINA BIFIDA
AFP MoM: 0.32
AFP Value: 10.1 ng/mL
Gest. Age on Collection Date: 16 wk
Maternal Age At EDD: 34.2 a
OSBR Risk 1 IN: 10000
Test Results:: NEGATIVE
Weight: 184 [lb_av]

## 2023-07-08 LAB — RESPIRATORY PANEL BY PCR

## 2023-07-08 LAB — WET PREP, GENITAL
Clue Cells Wet Prep HPF POC: NONE SEEN
Sperm: NONE SEEN
Trich, Wet Prep: NONE SEEN
WBC, Wet Prep HPF POC: <10 (ref <10)
Yeast Wet Prep HPF POC: NONE SEEN

## 2023-07-08 MED ORDER — BENZONATATE 100 MG PO CAPS: 100.0000 mg | ORAL_CAPSULE | Freq: Three times a day (TID) | ORAL | 0 refills | Status: DC

## 2023-07-08 MED ORDER — DEXTROMETHORPHAN POLISTIREX ER 30 MG/5ML PO SUER
30.0000 mg | Freq: Once | ORAL | Status: AC
  Administered 2023-07-08: 30 mg via ORAL
  Filled 2023-07-08: qty 5

## 2023-07-08 NOTE — Progress Notes (Signed)
 Fetal cardiac activity noted in both fetuses.  FHT dopplered via ultrasound by Peggye Bowers, NP.  Twin A 158 bpm & Twin B 141 bpm.

## 2023-07-08 NOTE — MAU Provider Note (Signed)
 None     S [name] is a 33 y.o. [id number] pregnant female at [redacted]w[redacted]d who presents to MAU today with complaint of cough for 1 week has not taken any medication. She reports that this cough is productive with yellow and white phlegm.  She also reports intermittent pain, unable to describe. Pain appears to be worse with movement.   Receives care at [facility]. Prenatal records reviewed.  Pertinent items noted in HPI and remainder of comprehensive ROS otherwise negative.   O BP 125/83 (BP Location: Right Arm)   Pulse 92   Temp 97.8 F (36.6 C)   Resp 19   Wt 84.1 kg   LMP 03/16/2023 (Approximate)   SpO2 100%   BMI 30.85 kg/m  Physical Exam Vitals and nursing note reviewed.  Constitutional:      Appearance: Normal appearance.  HENT:     Head: Normocephalic.  Cardiovascular:     Rate and Rhythm: Normal rate and regular rhythm.     Pulses: Normal pulses.     Heart sounds: Normal heart sounds.  Pulmonary:     Effort: Pulmonary effort is normal.     Breath sounds: Examination of the right-upper field reveals rhonchi. Examination of the left-upper field reveals rhonchi. Rhonchi present.     Comments: Cough present with yellow phlegm visualized Skin:    General: Skin is warm and dry.     Capillary Refill: Capillary refill takes less than 2 seconds.  Neurological:     Mental Status: She is alert and oriented to person, place, and time.  Psychiatric:        Mood and Affect: Mood normal.        Behavior: Behavior normal.      MDM: Wet prep obtained to rule out vaginal infection as cause of discomfort - vaginal swabs negative.  Medications and swabs ordered 2/2 cough. Swabs negative.  Consulted with Dr. Vallarie Gauze who agrees with POC  Deferred vaginal exam, unlikely to be preterm labor due to pattern of pain. Patient with history of full term deliveries.  MAU Course:  A Supervision of high risk pregnancy, antepartum  Twin gestation in second trimester, unspecified multiple  gestation type  Fetal heart rate present, second trimester  Upper respiratory tract infection, unspecified type  [redacted] weeks gestation of pregnancy  Round ligament pain  Medical screening exam complete  P Discharge from MAU in stable condition with return precautions Follow up at [facility] as scheduled for ongoing prenatal care Continue antihypertensives. Prescription sent for tessalon perles 100mg  every 8 hours as needed.  - Safe medications in pregnancy list provided. Recommended symptom management and return to MAU PRN.  - Patient reports that she has someone who can help her read [place] teaching materials.  - Remote Kiswahili  interpreter used for entire episode of care.  - Maternity support belt recommended and information on round ligament pain provided.   Allergies as of 07/08/2023   No Known Allergies      Medication List     TAKE these medications    benzonatate 100 MG capsule Commonly known as: TESSALON Take 1 capsule (100 mg total) by mouth every 8 (eight) hours.   labetalol  100 MG tablet Commonly known as: NORMODYNE  Take 0.5 tablets (50 mg total) by mouth 2 (two) times daily.   Prenatal Vitamin Plus Low Iron  27-1 MG Tabs 1 tab PO daily.        Raford Bunk, MSN, CNM 07/09/2023 9:58 AM  Certified Nurse Midwife, [facility]

## 2023-07-08 NOTE — Discharge Instructions (Signed)

## 2023-07-08 NOTE — MAU Note (Signed)
 Courtney Hodge is a 33 y.o. at [redacted]w[redacted]d here in MAU reporting: she began having abdominal pain this morning @ 0600.  Reports pain is intermittent, but unable to describe.  Denies VB or LOF.  Also c/o productive cough, coughing up yellow/white phlegm.  States hasn't taken any meds to treat.  LMP: 03/16/2023 Onset of complaint: today Pain score: 5 Vitals:   07/08/23 1628  BP: 125/83  Pulse: 92  Resp: 19  Temp: 97.8 F (36.6 C)  SpO2: 100%     FHT: deferred secondary twin gestation  Lab orders placed from triage: UA

## 2023-07-09 LAB — HORIZON CUSTOM: REPORT SUMMARY: POSITIVE — AB

## 2023-07-10 ENCOUNTER — Ambulatory Visit: Payer: Self-pay | Admitting: Obstetrics & Gynecology

## 2023-07-10 DIAGNOSIS — R8271 Bacteriuria: Secondary | ICD-10-CM

## 2023-07-10 LAB — GC/CHLAMYDIA PROBE AMP (~~LOC~~) NOT AT ARMC
Chlamydia: NEGATIVE
Comment: NEGATIVE
Comment: NORMAL
Neisseria Gonorrhea: NEGATIVE

## 2023-07-10 MED ORDER — AMOXICILLIN 500 MG PO CAPS: 500.0000 mg | ORAL_CAPSULE | Freq: Three times a day (TID) | ORAL | 0 refills | Status: DC

## 2023-08-03 ENCOUNTER — Encounter: Payer: Self-pay | Admitting: Obstetrics

## 2023-08-03 ENCOUNTER — Ambulatory Visit: Admitting: Obstetrics

## 2023-08-03 VITALS — BP 137/88 | HR 76 | Wt 187.0 lb

## 2023-08-03 DIAGNOSIS — O30002 Twin pregnancy, unspecified number of placenta and unspecified number of amniotic sacs, second trimester: Secondary | ICD-10-CM | POA: Diagnosis not present

## 2023-08-03 DIAGNOSIS — Z603 Acculturation difficulty: Secondary | ICD-10-CM | POA: Diagnosis not present

## 2023-08-03 DIAGNOSIS — O10919 Unspecified pre-existing hypertension complicating pregnancy, unspecified trimester: Secondary | ICD-10-CM

## 2023-08-03 DIAGNOSIS — R8271 Bacteriuria: Secondary | ICD-10-CM

## 2023-08-03 DIAGNOSIS — O0990 Supervision of high risk pregnancy, unspecified, unspecified trimester: Secondary | ICD-10-CM | POA: Diagnosis not present

## 2023-08-03 MED ORDER — PRENATAL VITAMIN PLUS LOW IRON 27-1 MG PO TABS: ORAL_TABLET | ORAL | 3 refills | Status: DC

## 2023-08-03 MED ORDER — LABETALOL HCL 100 MG PO TABS: 100.0000 mg | ORAL_TABLET | Freq: Two times a day (BID) | ORAL | 5 refills | Status: DC

## 2023-08-03 NOTE — Progress Notes (Signed)
 Subjective:  Courtney Hodge is a 33 y.o. G6P5005 at [redacted]w[redacted]d being seen today for ongoing prenatal care.  She is currently monitored for the following issues for this high-risk pregnancy and has Language barrier, cultural differences; Irregular menses; and Supervision of high risk pregnancy, antepartum on their problem list.  Patient reports no complaints.  Contractions: Not present.  .  Movement: Present. Denies leaking of fluid.   The following portions of the patient's history were reviewed and updated as appropriate: allergies, current medications, past family history, past medical history, past social history, past surgical history and problem list. Problem list updated.  Objective:   Vitals:   08/03/23 0939  BP: 137/88  Pulse: 76  Weight: 187 lb (84.8 kg)    Fetal Status:     Movement: Present     General:  Alert, oriented and cooperative. Patient is in no acute distress.  Skin: Skin is warm and dry. No rash noted.   Cardiovascular: Normal heart rate noted  Respiratory: Normal respiratory effort, no problems with respiration noted  Abdomen: Soft, gravid, appropriate for gestational age. Pain/Pressure: Absent     Pelvic:  Cervical exam deferred        Extremities: Normal range of motion.  Edema: None  Mental Status: Normal mood and affect. Normal behavior. Normal judgment and thought content.   Urinalysis:      Assessment and Plan:  Pregnancy: G6P5005 at [redacted]w[redacted]d  1. Supervision of high risk pregnancy, antepartum (Primary) Rx: - Prenatal Vit-Fe Fumarate-FA (PRENATAL VITAMIN PLUS LOW IRON ) 27-1 MG TABS; 1 tab PO daily.  Dispense: 100 tablet; Refill: 3  2. Language barrier, cultural differences - interpreter present  3. Twin gestation in second trimester, unspecified multiple gestation type - interval growth scans every 3-4 weeks  4. Chronic hypertension affecting pregnancy Rx: - labetalol  (NORMODYNE ) 100 MG tablet; Take 1 tablet (100 mg total) by mouth 2 (two) times daily.   Dispense: 60 tablet; Refill: 5  5. GBS bacteriuria, treated  - treat in labor    Preterm labor symptoms and general obstetric precautions including but not limited to vaginal bleeding, contractions, leaking of fluid and fetal movement were reviewed in detail with the patient. Please refer to After Visit Summary for other counseling recommendations.   Return in about 4 weeks (around 08/31/2023) for [facility].   Gabrielle Joiner, MD 08/03/2023

## 2023-08-03 NOTE — Progress Notes (Addendum)
 Out of prenatal vits. No refills for RX. Not taking Labetol. Denies problems today.

## 2023-08-04 DIAGNOSIS — O0940 Supervision of pregnancy with grand multiparity, unspecified trimester: Secondary | ICD-10-CM | POA: Insufficient documentation

## 2023-08-04 DIAGNOSIS — O10919 Unspecified pre-existing hypertension complicating pregnancy, unspecified trimester: Secondary | ICD-10-CM | POA: Insufficient documentation

## 2023-08-04 DIAGNOSIS — O30009 Twin pregnancy, unspecified number of placenta and unspecified number of amniotic sacs, unspecified trimester: Secondary | ICD-10-CM | POA: Insufficient documentation

## 2023-08-04 DIAGNOSIS — O0930 Supervision of pregnancy with insufficient antenatal care, unspecified trimester: Secondary | ICD-10-CM | POA: Insufficient documentation

## 2023-08-04 DIAGNOSIS — Z148 Genetic carrier of other disease: Secondary | ICD-10-CM | POA: Insufficient documentation

## 2023-08-15 ENCOUNTER — Ambulatory Visit

## 2023-08-15 ENCOUNTER — Ambulatory Visit: Attending: Obstetrics and Gynecology | Admitting: Obstetrics and Gynecology

## 2023-08-15 ENCOUNTER — Other Ambulatory Visit: Payer: Self-pay | Admitting: *Deleted

## 2023-08-15 ENCOUNTER — Other Ambulatory Visit: Payer: Self-pay | Admitting: Certified Nurse Midwife

## 2023-08-15 VITALS — BP 134/81 | HR 86

## 2023-08-15 DIAGNOSIS — O30042 Twin pregnancy, dichorionic/diamniotic, second trimester: Secondary | ICD-10-CM

## 2023-08-15 DIAGNOSIS — O0990 Supervision of high risk pregnancy, unspecified, unspecified trimester: Secondary | ICD-10-CM

## 2023-08-15 DIAGNOSIS — O10912 Unspecified pre-existing hypertension complicating pregnancy, second trimester: Secondary | ICD-10-CM | POA: Diagnosis not present

## 2023-08-15 DIAGNOSIS — O10012 Pre-existing essential hypertension complicating pregnancy, second trimester: Secondary | ICD-10-CM | POA: Diagnosis not present

## 2023-08-15 DIAGNOSIS — O0932 Supervision of pregnancy with insufficient antenatal care, second trimester: Secondary | ICD-10-CM | POA: Diagnosis not present

## 2023-08-15 DIAGNOSIS — O0942 Supervision of pregnancy with grand multiparity, second trimester: Secondary | ICD-10-CM | POA: Diagnosis not present

## 2023-08-15 DIAGNOSIS — Z148 Genetic carrier of other disease: Secondary | ICD-10-CM

## 2023-08-15 DIAGNOSIS — O10919 Unspecified pre-existing hypertension complicating pregnancy, unspecified trimester: Secondary | ICD-10-CM

## 2023-08-15 DIAGNOSIS — O0992 Supervision of high risk pregnancy, unspecified, second trimester: Secondary | ICD-10-CM | POA: Insufficient documentation

## 2023-08-15 DIAGNOSIS — O30002 Twin pregnancy, unspecified number of placenta and unspecified number of amniotic sacs, second trimester: Secondary | ICD-10-CM | POA: Diagnosis not present

## 2023-08-15 DIAGNOSIS — Z3A21 21 weeks gestation of pregnancy: Secondary | ICD-10-CM | POA: Insufficient documentation

## 2023-08-15 MED ORDER — ASPIRIN 81 MG PO TBEC: 81.0000 mg | DELAYED_RELEASE_TABLET | Freq: Every day | ORAL | 2 refills | Status: DC

## 2023-08-15 MED ORDER — PRENATAL VITAMIN PLUS LOW IRON 27-1 MG PO TABS: ORAL_TABLET | ORAL | 3 refills | Status: AC

## 2023-08-15 NOTE — Progress Notes (Signed)
 Prenatals and ASA 81mg  sent.  Camie Rote, MSN, CNM, RNC-OB Certified Nurse Midwife, [facility] 08/15/2023 2:39 PM

## 2023-08-15 NOTE — Progress Notes (Signed)
 Maternal-Fetal Medicine Consultation Name: Courtney Hodge MRN: [id number]  G6 P5005 at 21w 5d gestation.  Patient is here for fetal anatomy scan.  She was accompanied by her husband.  Patient speaks Swahili and I used interpreter services ([employer] services).   She has dichorionic-diamniotic twin pregnancy confirmed at previous ultrasound performed at your office. Her EDD is established by LMP date. This is a natural conception. Patient has chronic hypertension and will be starting labetalol  from today.  She does not take aspirin prophylaxis.  On cell-free fetal DNA screening, the risks of fetal aneuploidies are not increased.  NIPT confirmed dizygotic twins (female and female). On screening, she had increased carrier risk for spinal muscular atrophy.  Presenting history is significant for 5 term vaginal deliveries.  Ultrasound Dichorionic-diamniotic twin pregnancy.  Twin A: Lower fetus, maternal right, breech presentation, posterior placenta, female fetus.  Fetal biometry is consistent with the previously established dates.  Amniotic fluid is normal good fetal activity seen.  Fetal anatomical survey appears normal.  Anatomical survey was not completed because of fetal position.  Twin B: Upper fetus, maternal left, cephalic presentation, posterior placenta, female fetus. Fetal biometry is consistent with the previously established dates.  Amniotic fluid is normal good fetal activity seen.  Fetal anatomical survey appears normal.  Anatomical survey was not completed because of fetal position.  On transabdominal scan, cervix looks long and closed.  Our concerns include: Dichorionic-diamniotic twin pregnancy I explained the significance of chorionicity and its implications. Complications associated with twin pregnancy include preterm labor/delivery (most common), fetal growth restriction of one or both twins, miscarriage, malpresentations and increased cesarean delivery rate, postpartum hemorrhage. Maternal  complications including gestational diabetes and gestational hypertension/preeclampsia are more common.  I discussed the mode of delivery that is based on the presentations.  If both have Vx/Vx or Vx/non-vertex presentations, vaginal delivery may be considered. In Vx/non-vx, vaginal delivery followed by internal podalic version of second twin will achieve vaginal delivery. In non-vx first twin presentation, cesarean delivery will be performed.  I discussed ultrasound protocol in the management of term pregnancy.  I emphasized the importance of weight gain (24 lbs by 24 weeks) to improve fetal weight and decrease the chances of preterm delivery. Prophylactic low-dose aspirin delays or prevents preeclampsia. Twin pregnancy  and chronic hypertension are high-risk factors for preeclampsia. I recommended aspirin (81 mg daily) to be started from now till delivery. She does not have contraindications to aspirin.  Chronic hypertension -Adverse outcomes of severe chronic hypertension include maternal stroke, endorgan damage, coagulation disturbances. Placental abruption is more common. -Superimposed preeclampsia occurs in more than 30% of women with chronic hypertension I discussed the benefit of low-dose aspirin prophylaxis that helps delaying or preventing preeclampsia. Labetalol  can be safely given in pregnancy.  Alternative medications include nifedipine. I encouraged the patient to start taking labetalol  as prescribed at your office. -I discussed ultrasound protocol of monitoring fetal growth assessment and antenatal testing. -Timing of delivery: Twin pregnancy is complicated with chronic hypertension, we recommend delivery at [redacted] weeks gestation provide her blood pressures are well-controlled.   Recommendations -An appointment was made for her to return in 4 weeks for completion of fetal anatomy and in 8 weeks for fetal growth assessment. - An appointment was made for her to return in 4 weeks for  completion of fetal anatomy and in 8 weeks for fetal growth assessment. - Fetal growth assessments every 4 weeks till delivery. - Weekly antenatal testing from [redacted] weeks gestation until delivery. - Delivery at  [redacted] weeks gestation provided hypertension is well-controlled. - Aspirin 81 mg daily.  Consultation including face-to-face (more than 50%) counseling 45 minutes.

## 2023-08-31 ENCOUNTER — Ambulatory Visit: Admitting: Obstetrics

## 2023-08-31 VITALS — BP 133/88 | HR 86 | Wt 189.7 lb

## 2023-08-31 DIAGNOSIS — O30042 Twin pregnancy, dichorionic/diamniotic, second trimester: Secondary | ICD-10-CM

## 2023-08-31 DIAGNOSIS — O0990 Supervision of high risk pregnancy, unspecified, unspecified trimester: Secondary | ICD-10-CM

## 2023-08-31 DIAGNOSIS — Z603 Acculturation difficulty: Secondary | ICD-10-CM | POA: Diagnosis not present

## 2023-08-31 DIAGNOSIS — R8271 Bacteriuria: Secondary | ICD-10-CM | POA: Diagnosis not present

## 2023-08-31 NOTE — Progress Notes (Signed)
 Subjective:  Courtney Hodge is a 33 y.o. G6P5005 at [redacted]w[redacted]d being seen today for ongoing prenatal care.  She is currently monitored for the following issues for this high-risk pregnancy and has Language barrier, cultural differences; Supervision of high risk pregnancy, antepartum; Grand multiparity with antenatal problem; Late prenatal care affecting pregnancy; Twin pregnancy; Chronic hypertension during pregnancy, antepartum; and Carrier of spinal muscular atrophy-^ risk on their problem list.  Patient reports no complaints.  Contractions: Not present. Vag. Bleeding: None.  Movement: Present. Denies leaking of fluid.   The following portions of the patient's history were reviewed and updated as appropriate: allergies, current medications, past family history, past medical history, past social history, past surgical history and problem list. Problem list updated.  Objective:   Vitals:   08/31/23 0826  BP: 133/88  Pulse: 86  Weight: 189 lb 11.2 oz (86 kg)    Fetal Status:     Movement: Present     General:  Alert, oriented and cooperative. Patient is in no acute distress.  Skin: Skin is warm and dry. No rash noted.   Cardiovascular: Normal heart rate noted  Respiratory: Normal respiratory effort, no problems with respiration noted  Abdomen: Soft, gravid, appropriate for gestational age. Pain/Pressure: Absent     Pelvic:  Cervical exam deferred        Extremities: Normal range of motion.  Edema: None  Mental Status: Normal mood and affect. Normal behavior. Normal judgment and thought content.   Urinalysis:      Assessment and Plan:  Pregnancy: G6P5005 at [redacted]w[redacted]d  1. Supervision of high risk pregnancy, antepartum (Primary)  2. Language barrier, cultural differences - interpreter present  3. Dichorionic diamniotic twin pregnancy in second trimester  4. GBS bacteriuria - treat in labor    Preterm labor symptoms and general obstetric precautions including but not limited to vaginal  bleeding, contractions, leaking of fluid and fetal movement were reviewed in detail with the patient. Please refer to After Visit Summary for other counseling recommendations.   Return in about 4 weeks (around 09/28/2023) for [facility], 2 hour OGTT.   Rudy Carlin LABOR, MD 11/01/2023

## 2023-08-31 NOTE — Progress Notes (Signed)
 Pt presents for rob. Pt has no questions or concerns at this time.

## 2023-09-12 ENCOUNTER — Ambulatory Visit

## 2023-09-12 ENCOUNTER — Other Ambulatory Visit

## 2023-10-03 ENCOUNTER — Other Ambulatory Visit

## 2023-10-03 ENCOUNTER — Encounter: Payer: Self-pay | Admitting: Emergency Medicine

## 2023-10-03 ENCOUNTER — Ambulatory Visit (INDEPENDENT_AMBULATORY_CARE_PROVIDER_SITE_OTHER): Admitting: Physician Assistant

## 2023-10-03 VITALS — BP 118/82 | HR 83 | Wt 193.1 lb

## 2023-10-03 DIAGNOSIS — Z3A28 28 weeks gestation of pregnancy: Secondary | ICD-10-CM | POA: Diagnosis not present

## 2023-10-03 DIAGNOSIS — O0990 Supervision of high risk pregnancy, unspecified, unspecified trimester: Secondary | ICD-10-CM | POA: Diagnosis not present

## 2023-10-03 DIAGNOSIS — Z6791 Unspecified blood type, Rh negative: Secondary | ICD-10-CM

## 2023-10-03 DIAGNOSIS — O30003 Twin pregnancy, unspecified number of placenta and unspecified number of amniotic sacs, third trimester: Secondary | ICD-10-CM

## 2023-10-03 DIAGNOSIS — O10919 Unspecified pre-existing hypertension complicating pregnancy, unspecified trimester: Secondary | ICD-10-CM

## 2023-10-03 DIAGNOSIS — O26899 Other specified pregnancy related conditions, unspecified trimester: Secondary | ICD-10-CM

## 2023-10-03 NOTE — Progress Notes (Signed)
 Pt presents for rob pt has no questions or concerns at this time.

## 2023-10-03 NOTE — Progress Notes (Signed)
   PRENATAL VISIT NOTE  Subjective:  Courtney Hodge is a 33 y.o. G6P5005 at [redacted]w[redacted]d being seen today for ongoing prenatal care.  She is currently monitored for the following issues for this high-risk pregnancy and has Language barrier, cultural differences; Supervision of high risk pregnancy, antepartum; Grand multiparity with antenatal problem; Late prenatal care affecting pregnancy; Twin pregnancy; Chronic hypertension during pregnancy, antepartum; and Carrier of spinal muscular atrophy-^ risk on their problem list.  Patient reports no complaints.  Contractions: Not present. Vag. Bleeding: None.  Movement: Present. Denies leaking of fluid.   The following portions of the patient's history were reviewed and updated as appropriate: allergies, current medications, past family history, past medical history, past social history, past surgical history and problem list.   Objective:    Vitals:   10/03/23 1001  BP: 118/82  Pulse: 83  Weight: 193 lb 1.6 oz (87.6 kg)    Fetal Status:  Fetal Heart Rate (bpm): A 152/B 148 Fundal Height: 33 cm Movement: Present    General: Alert, oriented and cooperative. Patient is in no acute distress.  Skin: Skin is warm and dry. No rash noted.   Cardiovascular: Normal heart rate noted  Respiratory: Normal respiratory effort, no problems with respiration noted  Abdomen: Soft, gravid, appropriate for gestational age.  Pain/Pressure: Present     Pelvic: Cervical exam deferred        Extremities: Normal range of motion.  Edema: None  Mental Status: Normal mood and affect. Normal behavior. Normal judgment and thought content.   Assessment and Plan:  Pregnancy: G6P5005 at [redacted]w[redacted]d  1. Supervision of high risk pregnancy, antepartum (Primary) Patient doing well, feeling regular fetal movement  BP, FHR x2, FH appropriate  2. [redacted] weeks gestation of pregnancy Emesis with glucose beverage. Only able to get fasting glucose today.  - Glucose Tolerance, 2 Hours w/1 Hour -  HIV antibody (with reflex) - CBC - RPR   3. Chronic hypertension during pregnancy, antepartum Normotensive on labetalol  100 mg Continue ASA  4. Twin gestation in third trimester, unspecified multiple gestation type Dichorionic-diamniotic 10/10/23 flu growth and antenatal testing Delivery at 37 weeks if BP well-controlled   5. Rh negative state in antepartum period Patient declined rhogam today after detailed discussion of its indication and possible fetal risks. She was given an information sheet about Rh negative status and use of rhogam in case she would like it administered in the future.   Preterm labor symptoms and general obstetric precautions including but not limited to vaginal bleeding, contractions, leaking of fluid and fetal movement were reviewed in detail with the patient.  Please refer to After Visit Summary for other counseling recommendations.   Return in about 2 weeks (around 10/17/2023) for [facility].  Future Appointments  Date Time Provider Department Center  10/10/2023 10:00 AM [facility] PROVIDER 1 [facility] [address]  10/10/2023 10:30 AM [facility] US5 [facility]  10/17/2023  9:15 AM [place], [place] LABOR, NP [facility] None    Jorene FORBES Moats, PA-C

## 2023-10-04 ENCOUNTER — Encounter: Payer: Self-pay | Admitting: Physician Assistant

## 2023-10-04 LAB — RPR: RPR Ser Ql: NONREACTIVE

## 2023-10-04 LAB — CBC
Hematocrit: 32.6 % — ABNORMAL LOW (ref 34.0–46.6)
Hemoglobin: 11 g/dL — ABNORMAL LOW (ref 11.1–15.9)
MCH: 30.7 pg (ref 26.6–33.0)
MCHC: 33.7 g/dL (ref 31.5–35.7)
MCV: 91 fL (ref 79–97)
Platelets: 147 x10E3/uL — ABNORMAL LOW (ref 150–450)
RBC: 3.58 x10E6/uL — ABNORMAL LOW (ref 3.77–5.28)
RDW: 13.7 % (ref 11.7–15.4)
WBC: 6.1 x10E3/uL (ref 3.4–10.8)

## 2023-10-04 LAB — HIV ANTIBODY (ROUTINE TESTING W REFLEX): HIV Screen 4th Generation wRfx: NONREACTIVE

## 2023-10-05 LAB — GLUCOSE TOLERANCE, 2 HOURS W/ 1HR: Glucose, Fasting: 78 mg/dL (ref 70–91)

## 2023-10-10 ENCOUNTER — Other Ambulatory Visit: Payer: Self-pay

## 2023-10-10 ENCOUNTER — Ambulatory Visit (HOSPITAL_BASED_OUTPATIENT_CLINIC_OR_DEPARTMENT_OTHER): Attending: Obstetrics and Gynecology | Admitting: Obstetrics and Gynecology

## 2023-10-10 ENCOUNTER — Ambulatory Visit (HOSPITAL_BASED_OUTPATIENT_CLINIC_OR_DEPARTMENT_OTHER)

## 2023-10-10 VITALS — BP 130/81 | HR 98

## 2023-10-10 DIAGNOSIS — Z363 Encounter for antenatal screening for malformations: Secondary | ICD-10-CM | POA: Insufficient documentation

## 2023-10-10 DIAGNOSIS — Z148 Genetic carrier of other disease: Secondary | ICD-10-CM | POA: Diagnosis not present

## 2023-10-10 DIAGNOSIS — O10013 Pre-existing essential hypertension complicating pregnancy, third trimester: Secondary | ICD-10-CM

## 2023-10-10 DIAGNOSIS — O30043 Twin pregnancy, dichorionic/diamniotic, third trimester: Secondary | ICD-10-CM | POA: Diagnosis not present

## 2023-10-10 DIAGNOSIS — O0943 Supervision of pregnancy with grand multiparity, third trimester: Secondary | ICD-10-CM | POA: Insufficient documentation

## 2023-10-10 DIAGNOSIS — O10919 Unspecified pre-existing hypertension complicating pregnancy, unspecified trimester: Secondary | ICD-10-CM

## 2023-10-10 DIAGNOSIS — Z3A29 29 weeks gestation of pregnancy: Secondary | ICD-10-CM | POA: Diagnosis not present

## 2023-10-10 DIAGNOSIS — O30003 Twin pregnancy, unspecified number of placenta and unspecified number of amniotic sacs, third trimester: Secondary | ICD-10-CM

## 2023-10-10 DIAGNOSIS — O30042 Twin pregnancy, dichorionic/diamniotic, second trimester: Secondary | ICD-10-CM

## 2023-10-10 DIAGNOSIS — O0990 Supervision of high risk pregnancy, unspecified, unspecified trimester: Secondary | ICD-10-CM

## 2023-10-10 NOTE — Progress Notes (Signed)
 Maternal-Fetal Medicine Consultation Name: Courtney Hodge MRN: [id number]  G6 P5005 at 29w 5d gestation.  Dichorionic-diamniotic twin pregnancy. Patient return for fetal growth assessment.  Patient reports she does not have gestational diabetes (we could find only fasting levels that was normal). - Chronic hypertension.  Patient takes labetalol  100 mg twice daily.  Blood pressure today at our office is 130/81 mmHg. Patient had opted not to take RhoGAM. Her blood type is O negative.  Ultrasound Twin A: Maternal right, lower fetus, breech presentation, posterior placenta, female fetus.  Amniotic fluid is normal and good fetal activity seen.  Fetal growth is appropriate for gestational age. Twin B: Maternal left, upper fetus, transverse lie and head to maternal right, posterior placenta.  Amniotic fluid is normal good fetal activity seen.  Fetal growth is appropriate for gestational age.  Growth discordance: 3% (normal)  I counseled the patient with the help of language interpreter ([employer] services).  I reassured the patient of normal fetal growth assessment. I encouraged her to screen for gestational diabetes.  Alternatively, she can check her blood glucose daily. We discussed timing of delivery.  Given that she has twin pregnancy and hypertension, I recommend delivery at [redacted] weeks gestation.  Recommendations -Weekly BPP from [redacted] weeks gestation until delivery. -Fetal Rh antigen screening will be requested from [facility] and that may reinforce patient's decision to take RhoGAM.  Consultation including face-to-face (more than 50%) counseling 10 minutes.

## 2023-10-17 ENCOUNTER — Encounter: Payer: Self-pay | Admitting: Nurse Practitioner

## 2023-10-17 ENCOUNTER — Ambulatory Visit (INDEPENDENT_AMBULATORY_CARE_PROVIDER_SITE_OTHER): Admitting: Nurse Practitioner

## 2023-10-17 VITALS — BP 145/87 | HR 87 | Wt 198.0 lb

## 2023-10-17 DIAGNOSIS — Z758 Other problems related to medical facilities and other health care: Secondary | ICD-10-CM

## 2023-10-17 DIAGNOSIS — O99891 Other specified diseases and conditions complicating pregnancy: Secondary | ICD-10-CM | POA: Diagnosis not present

## 2023-10-17 DIAGNOSIS — M549 Dorsalgia, unspecified: Secondary | ICD-10-CM

## 2023-10-17 DIAGNOSIS — O10919 Unspecified pre-existing hypertension complicating pregnancy, unspecified trimester: Secondary | ICD-10-CM

## 2023-10-17 DIAGNOSIS — O0990 Supervision of high risk pregnancy, unspecified, unspecified trimester: Secondary | ICD-10-CM

## 2023-10-17 DIAGNOSIS — O30042 Twin pregnancy, dichorionic/diamniotic, second trimester: Secondary | ICD-10-CM | POA: Diagnosis not present

## 2023-10-17 DIAGNOSIS — Z3A30 30 weeks gestation of pregnancy: Secondary | ICD-10-CM

## 2023-10-17 DIAGNOSIS — Z603 Acculturation difficulty: Secondary | ICD-10-CM

## 2023-10-17 MED ORDER — LABETALOL HCL 100 MG PO TABS: 100.0000 mg | ORAL_TABLET | Freq: Two times a day (BID) | ORAL | 5 refills | Status: DC

## 2023-10-17 MED ORDER — CYCLOBENZAPRINE HCL 10 MG PO TABS: 10.0000 mg | ORAL_TABLET | Freq: Two times a day (BID) | ORAL | 0 refills | Status: DC | PRN

## 2023-10-17 MED ORDER — ASPIRIN 81 MG PO TBEC: 81.0000 mg | DELAYED_RELEASE_TABLET | Freq: Every day | ORAL | 2 refills | Status: DC

## 2023-10-17 NOTE — Progress Notes (Signed)
 PRENATAL VISIT NOTE  Subjective:  Courtney Hodge is a 33 y.o. G6P5005 at [redacted]w[redacted]d being seen today for ongoing prenatal care.  She is currently monitored for the following issues for this high-risk pregnancy and has Language barrier, cultural differences; Rh negative state in antepartum period; Supervision of high risk pregnancy, antepartum; Grand multiparity with antenatal problem; Late prenatal care affecting pregnancy; Twin pregnancy; Chronic hypertension during pregnancy, antepartum; and Carrier of spinal muscular atrophy-^ risk on their problem list.  Patient reports no complaints.  Contractions: Not present. Vag. Bleeding: None.  Movement: Present. Denies leaking of fluid.   The following portions of the patient's history were reviewed and updated as appropriate: allergies, current medications, past family history, past medical history, past social history, past surgical history and problem list.   Objective:   Vitals:   10/17/23 0930  BP: (!) 145/87  Pulse: 87  Weight: 198 lb (89.8 kg)    Fetal Status: Fetal Heart Rate (bpm): 145/138   Movement: Present     General:  Alert, oriented and cooperative. Patient is in no acute distress.  Skin: Skin is warm and dry. No rash noted.   Cardiovascular: Normal heart rate noted  Respiratory: Normal respiratory effort, no problems with respiration noted  Abdomen: Soft, gravid, appropriate for gestational age.  Pain/Pressure: Absent     Pelvic: Cervical exam deferred        Extremities: Normal range of motion.  Edema: None  Mental Status: Normal mood and affect. Normal behavior. Normal judgment and thought content.   Assessment and Plan:  Pregnancy: H3E4994 at [redacted]w[redacted]d   Supervision of high risk pregnancy, antepartum - patient declined Rhogam at 34 weeks - Leona Leona TIUP   Back pain affecting pregnancy in third trimester Maternity belt for support Flexeril  10 mg BID prn   Chronic hypertension affecting pregnancy MRBP today Patient states  she was unaware of starting labetalol  100 mg BID and therfore has not been taking the medication Will pick up RX today and start medication Asymptomatic for s/s of preeclampsia  Continue daily ASA PreE labs ordered  Dichorionic diamniotic twin pregnancy in second trimester -Reviewed ultrasound from 10/10/23  -Growth discordance: 3% (normal)  -Weekly BPP until delivery - MFM recommends 37 week delivery if clinically stable   Language barrier In person interpreter present during entire encounter   [redacted] weeks gestation of pregnancy - FHR appropriate x 2 - Doing well - Both babies feeling good movements     Orders Placed This Encounter  Procedures   CBC   Comp Met (CMET)   Protein / creatinine ratio, urine    Meds ordered this encounter  Medications   aspirin  EC 81 MG tablet    Sig: Take 1 tablet (81 mg total) by mouth daily. Take after 12 weeks for prevention of preeclampsia later in pregnancy    Dispense:  300 tablet    Refill:  2    Supervising Provider:   PRATT, TANYA S [[id number]]   cyclobenzaprine  (FLEXERIL ) 10 MG tablet    Sig: Take 1 tablet (10 mg total) by mouth 2 (two) times daily as needed for muscle spasms.    Dispense:  20 tablet    Refill:  0    Supervising Provider:   PRATT, TANYA S [[id number]]   labetalol  (NORMODYNE ) 100 MG tablet    Sig: Take 1 tablet (100 mg total) by mouth 2 (two) times daily.    Dispense:  60 tablet    Refill:  5  Supervising Provider:   FREDIRICK GLENYS RAMAN [[id number]]     Preterm labor symptoms and general obstetric precautions including but not limited to vaginal bleeding, contractions, leaking of fluid and fetal movement were reviewed in detail with the patient. Please refer to After Visit Summary for other counseling recommendations.   Return in about 2 weeks (around 10/31/2023) for [facility].  Future Appointments  Date Time Provider Department Center  10/31/2023 11:15 AM Constant, Winton, MD [facility] None    Olam DELENA Dalton, NP

## 2023-10-17 NOTE — Progress Notes (Signed)
 Denies concerns today.

## 2023-10-18 ENCOUNTER — Ambulatory Visit: Payer: Self-pay | Admitting: Nurse Practitioner

## 2023-10-18 DIAGNOSIS — O99013 Anemia complicating pregnancy, third trimester: Secondary | ICD-10-CM

## 2023-10-18 LAB — COMPREHENSIVE METABOLIC PANEL WITH GFR
ALT: 11 IU/L (ref 0–32)
AST: 25 IU/L (ref 0–40)
Albumin: 3 g/dL — ABNORMAL LOW (ref 3.9–4.9)
Alkaline Phosphatase: 212 IU/L — ABNORMAL HIGH (ref 44–121)
BUN/Creatinine Ratio: 9 (ref 9–23)
BUN: 6 mg/dL (ref 6–20)
Bilirubin Total: 0.3 mg/dL (ref 0.0–1.2)
CO2: 18 mmol/L — ABNORMAL LOW (ref 20–29)
Calcium: 8.7 mg/dL (ref 8.7–10.2)
Chloride: 106 mmol/L (ref 96–106)
Creatinine, Ser: 0.68 mg/dL (ref 0.57–1.00)
Globulin, Total: 2.4 g/dL (ref 1.5–4.5)
Glucose: 78 mg/dL (ref 70–99)
Potassium: 3.6 mmol/L (ref 3.5–5.2)
Sodium: 138 mmol/L (ref 134–144)
Total Protein: 5.4 g/dL — ABNORMAL LOW (ref 6.0–8.5)
eGFR: 117 mL/min/1.73 (ref >59)

## 2023-10-18 LAB — PROTEIN / CREATININE RATIO, URINE
Creatinine, Urine: 24.4 mg/dL
Protein, Ur: 4.6 mg/dL
Protein/Creat Ratio: 189 mg/g{creat} (ref 0–200)

## 2023-10-18 LAB — CBC
Hematocrit: 30.9 % — ABNORMAL LOW (ref 34.0–46.6)
Hemoglobin: 10.5 g/dL — ABNORMAL LOW (ref 11.1–15.9)
MCH: 30.6 pg (ref 26.6–33.0)
MCHC: 34 g/dL (ref 31.5–35.7)
MCV: 90 fL (ref 79–97)
Platelets: 129 x10E3/uL — ABNORMAL LOW (ref 150–450)
RBC: 3.43 x10E6/uL — ABNORMAL LOW (ref 3.77–5.28)
RDW: 13.6 % (ref 11.7–15.4)
WBC: 4.5 x10E3/uL (ref 3.4–10.8)

## 2023-10-18 MED ORDER — FERROUS SULFATE 325 (65 FE) MG PO TBEC: 325.0000 mg | DELAYED_RELEASE_TABLET | ORAL | 2 refills | Status: AC

## 2023-10-24 ENCOUNTER — Telehealth: Payer: Self-pay | Admitting: *Deleted

## 2023-10-24 NOTE — Telephone Encounter (Signed)
 TC to discuss testing for GDM. Pt was not able to tolerate glucose load at GTT and vomited. Pt also had difficulty with vasovagal symptoms with blood draw. Would like to offer pt repeat testing, options to check CBGs for 2 weeks, or Dexcom. No answer. VM with instructions to call back to discuss above. Swahili telephone interpreter used.

## 2023-10-30 ENCOUNTER — Ambulatory Visit: Payer: Self-pay | Admitting: Physician Assistant

## 2023-10-31 ENCOUNTER — Ambulatory Visit: Admitting: Obstetrics and Gynecology

## 2023-10-31 ENCOUNTER — Other Ambulatory Visit

## 2023-10-31 ENCOUNTER — Encounter: Payer: Self-pay | Admitting: Obstetrics and Gynecology

## 2023-10-31 VITALS — BP 148/90 | HR 93 | Wt 199.0 lb

## 2023-10-31 DIAGNOSIS — O10919 Unspecified pre-existing hypertension complicating pregnancy, unspecified trimester: Secondary | ICD-10-CM | POA: Diagnosis not present

## 2023-10-31 DIAGNOSIS — Z6791 Unspecified blood type, Rh negative: Secondary | ICD-10-CM

## 2023-10-31 DIAGNOSIS — O26899 Other specified pregnancy related conditions, unspecified trimester: Secondary | ICD-10-CM

## 2023-10-31 DIAGNOSIS — O30003 Twin pregnancy, unspecified number of placenta and unspecified number of amniotic sacs, third trimester: Secondary | ICD-10-CM | POA: Diagnosis not present

## 2023-10-31 DIAGNOSIS — Z3A32 32 weeks gestation of pregnancy: Secondary | ICD-10-CM | POA: Diagnosis not present

## 2023-10-31 DIAGNOSIS — Z603 Acculturation difficulty: Secondary | ICD-10-CM

## 2023-10-31 DIAGNOSIS — O0990 Supervision of high risk pregnancy, unspecified, unspecified trimester: Secondary | ICD-10-CM | POA: Diagnosis not present

## 2023-10-31 MED ORDER — RHO D IMMUNE GLOBULIN 1500 UNIT/2ML IJ SOSY
300.0000 ug | PREFILLED_SYRINGE | Freq: Once | INTRAMUSCULAR | Status: AC
  Administered 2023-10-31: 300 ug via INTRAMUSCULAR

## 2023-10-31 NOTE — Progress Notes (Addendum)
   PRENATAL VISIT NOTE  Subjective:  Courtney Hodge is a 33 y.o. G6P5005 at [redacted]w[redacted]d being seen today for ongoing prenatal care.  She is currently monitored for the following issues for this high-risk pregnancy and has Language barrier, cultural differences; Rh negative state in antepartum period; Supervision of high risk pregnancy, antepartum; Grand multiparity with antenatal problem; Late prenatal care affecting pregnancy; Twin pregnancy; Chronic hypertension during pregnancy, antepartum; and Carrier of spinal muscular atrophy-^ risk on their problem list.  Patient reports no complaints.  Contractions: Not present. Vag. Bleeding: None.  Movement: Present. Denies leaking of fluid.   The following portions of the patient's history were reviewed and updated as appropriate: allergies, current medications, past family history, past medical history, past social history, past surgical history and problem list.   Objective:    Vitals:   10/31/23 1130 10/31/23 1138  BP: (!) 144/91 (!) 148/90  Pulse: 80 93  Weight: 199 lb (90.3 kg)     Fetal Status:  Fetal Heart Rate (bpm): 152/148   Movement: Present    General: Alert, oriented and cooperative. Patient is in no acute distress.  Skin: Skin is warm and dry. No rash noted.   Cardiovascular: Normal heart rate noted  Respiratory: Normal respiratory effort, no problems with respiration noted  Abdomen: Soft, gravid, appropriate for gestational age.  Pain/Pressure: Absent     Pelvic: Cervical exam deferred        Extremities: Normal range of motion.  Edema: None  Mental Status: Normal mood and affect. Normal behavior. Normal judgment and thought content.   Assessment and Plan:  Pregnancy: G6P5005 at [redacted]w[redacted]d 1. Supervision of high risk pregnancy, antepartum (Primary) Patient is doing well without complaints Patient missed 1 hr glucola scheduled for today Will have patient come prior to next appointment for 2 hour glucola  Patient is undecided on  contraception  2. [redacted] weeks gestation of pregnancy   3. Twin gestation in third trimester, unspecified multiple gestation type Follow up weekly BPP and follow up growth as ordered by MFM Will assist patient with scheduling - US  MFM FETAL BPP WO NON STRESS - US  MFM FETAL BPP WO NON STRESS  4. Chronic hypertension during pregnancy, antepartum Elevated BP today and at her last visit Patient states she has only been taking labetalol  at bedtime only. Reviewed correct dosage and frequency. May need to increase dosage if elevated at next visit Per MFM will plan for delivery at 37 weeks Patient unable to stay for NST today  - US  MFM FETAL BPP WO NON STRESS - US  MFM FETAL BPP WO NON STRESS  5. Rh negative state in antepartum period Patient agreed to rhogam today  6. Language barrier, cultural differences Swahili interpreter present during the encounter  Preterm labor symptoms and general obstetric precautions including but not limited to vaginal bleeding, contractions, leaking of fluid and fetal movement were reviewed in detail with the patient. Please refer to After Visit Summary for other counseling recommendations.   Return in about 2 weeks (around 11/14/2023) for in person, ROB, High risk.  No future appointments.  Winton Felt, MD

## 2023-10-31 NOTE — Progress Notes (Signed)
 Administrations This Visit     rho (d) immune globulin  (RHIG/RHOPHYLAC ) injection 300 mcg     Admin Date 10/31/2023 Action Given Dose 300 mcg Route Intramuscular Documented By Burnard Niels PARAS, RMA

## 2023-11-02 ENCOUNTER — Other Ambulatory Visit

## 2023-11-14 ENCOUNTER — Ambulatory Visit (HOSPITAL_BASED_OUTPATIENT_CLINIC_OR_DEPARTMENT_OTHER): Admitting: Obstetrics and Gynecology

## 2023-11-14 ENCOUNTER — Other Ambulatory Visit: Payer: Self-pay | Admitting: Obstetrics and Gynecology

## 2023-11-14 ENCOUNTER — Ambulatory Visit: Admitting: Obstetrics & Gynecology

## 2023-11-14 ENCOUNTER — Ambulatory Visit: Attending: Obstetrics and Gynecology

## 2023-11-14 VITALS — BP 171/107 | HR 97 | Wt 205.0 lb

## 2023-11-14 DIAGNOSIS — O10919 Unspecified pre-existing hypertension complicating pregnancy, unspecified trimester: Secondary | ICD-10-CM | POA: Insufficient documentation

## 2023-11-14 DIAGNOSIS — O30042 Twin pregnancy, dichorionic/diamniotic, second trimester: Secondary | ICD-10-CM

## 2023-11-14 DIAGNOSIS — O30043 Twin pregnancy, dichorionic/diamniotic, third trimester: Secondary | ICD-10-CM | POA: Diagnosis not present

## 2023-11-14 DIAGNOSIS — O30003 Twin pregnancy, unspecified number of placenta and unspecified number of amniotic sacs, third trimester: Secondary | ICD-10-CM | POA: Diagnosis present

## 2023-11-14 DIAGNOSIS — Z3A34 34 weeks gestation of pregnancy: Secondary | ICD-10-CM

## 2023-11-14 DIAGNOSIS — O10013 Pre-existing essential hypertension complicating pregnancy, third trimester: Secondary | ICD-10-CM

## 2023-11-14 DIAGNOSIS — Z603 Acculturation difficulty: Secondary | ICD-10-CM

## 2023-11-14 DIAGNOSIS — Z6791 Unspecified blood type, Rh negative: Secondary | ICD-10-CM

## 2023-11-14 DIAGNOSIS — O0990 Supervision of high risk pregnancy, unspecified, unspecified trimester: Secondary | ICD-10-CM | POA: Diagnosis not present

## 2023-11-14 DIAGNOSIS — O26899 Other specified pregnancy related conditions, unspecified trimester: Secondary | ICD-10-CM

## 2023-11-14 DIAGNOSIS — O365931 Maternal care for other known or suspected poor fetal growth, third trimester, fetus 1: Secondary | ICD-10-CM

## 2023-11-14 DIAGNOSIS — O0943 Supervision of pregnancy with grand multiparity, third trimester: Secondary | ICD-10-CM

## 2023-11-14 NOTE — Progress Notes (Signed)
   PRENATAL VISIT NOTE  Subjective:  Courtney Hodge is a 33 y.o. H3E4994 at [redacted]w[redacted]d being seen today for ongoing prenatal care.  She is currently monitored for the following issues for this high-risk pregnancy and has Language barrier, cultural differences; Rh negative state in antepartum period; Supervision of high risk pregnancy, antepartum; Grand multiparity with antenatal problem; Late prenatal care affecting pregnancy; Twin pregnancy; Chronic hypertension during pregnancy, antepartum; and Carrier of spinal muscular atrophy-^ risk on their problem list.  Patient reports no complaints.  Contractions: Not present. Vag. Bleeding: None.  Movement: Present. Denies leaking of fluid.   The following portions of the patient's history were reviewed and updated as appropriate: allergies, current medications, past family history, past medical history, past social history, past surgical history and problem list.   Objective:    Vitals:   11/14/23 0956 11/14/23 1020  BP: (!) 162/94 (!) 171/107  Pulse: 81 97  Weight: 205 lb (93 kg)     Fetal Status:  Fetal Heart Rate (bpm): 140/135   Movement: Present    General: Alert, oriented and cooperative. Patient is in no acute distress.  Skin: Skin is warm and dry. No rash noted.   Cardiovascular: Normal heart rate noted  Respiratory: Normal respiratory effort, no problems with respiration noted  Abdomen: Soft, gravid, appropriate for gestational age.  Pain/Pressure: Absent     Pelvic: Cervical exam deferred        Extremities: Normal range of motion.     Mental Status: Normal mood and affect. Normal behavior. Normal judgment and thought content.   Assessment and Plan:  Pregnancy: G6P5005 at [redacted]w[redacted]d 1. Chronic hypertension during pregnancy, antepartum (Primary) Uncontrolled suspect preeclampsia  2. Language barrier, cultural differences Swahili interpreter  3. Twin gestation in third trimester, unspecified multiple gestation type Di/di  4. Supervision  of high risk pregnancy, antepartum   5. Rh negative state in antepartum period  To MAU for evaluation, Dr. MARLA. [place] notified Preterm labor symptoms and general obstetric precautions including but not limited to vaginal bleeding, contractions, leaking of fluid and fetal movement were reviewed in detail with the patient. Please refer to After Visit Summary for other counseling recommendations.   Return in about 1 week (around 11/21/2023).  No future appointments.  Lynwood Solomons, MD

## 2023-11-14 NOTE — Progress Notes (Signed)
 Maternal-Fetal Medicine Consultation  Name: Courtney Hodge  MRN: [id number]  GA: H3E4994 [redacted]w[redacted]d   Patient was evaluated at your office today and her blood pressures where 162/94 and 171/107 mmHg.  She was advised to go to the MAU for further evaluation.  Patient showed up to our office history of going to the MAU.  We performed ultrasound. She has chronic hypertension and takes labetalol  irregularly.  She had not taken her morning dose.  Ultrasound Dichorionic-diamniotic twin pregnancy. Twin A: Maternal right, lower fetus, breech presentation, posterior placenta.  Amniotic fluid is normal and good fetal activity seen.  The estimated fetal weight is at the 9th percentile and the abdominal circumference measurement at the 4th percentile.    Fetal breathing movements did not meet the criteria of BPP. Umbilical artery Doppler showed normal forward diastolic flow.  BPP 6/8.  Twin B: Maternal left, upper fetus, transverse lie and head to maternal right, posterior placenta.  Amniotic fluid is normal and good fetal activity seen.  Fetal growth is appropriate for gestational age.  Umbilical artery Doppler showed normal for diastolic flow.  BPP 8/8.  Growth discordancy: 18% (normal).  Blood pressures today at our office were 139/101 70/92 mmHg.  She does not have signs and symptoms of severe features of preeclampsia.  Our concerns include: Severe range hypertension I counseled the couple on the findings of severe range hypertension both at prenatal visit and at our office.  Possible complications of severe range hypertension include maternal stroke, eclampsia, endorgan damage and placental abruption.  I discussed the possibility of superimposed preeclampsia and advised MAU evaluation with series of blood pressures and labs. Delivery is a possibility if severe range hypertension persists and superimposed preeclampsia is suspected.  Twin pregnancy with selective fetal growth restriction I explained the  finding of fetal growth restriction in twin A (not severe) and discussed the significance of twin A's BPP score and recommended NST at MAU. If hypertension is controlled, delivery at 67- or 37-weeks' gestation is reasonable. I strongly recommended that the patient go to the MAU from our office.  I called MAU and informed the team.  Recommendations - No follow-up appointments at our office were made.  Appointments to be made after MAU evaluation.     Consultation including face-to-face (more than 50%) counseling 30 minutes.

## 2023-11-14 NOTE — Progress Notes (Signed)
 Pt did not complete glucose testing, pt may not want to complete.  Pt has not yet started antenatal testing, states MFM has not called to schedule.   Pt has elevated BP in office today, pt is taking Labetalol .

## 2023-11-15 ENCOUNTER — Telehealth: Payer: Self-pay | Admitting: *Deleted

## 2023-11-15 NOTE — Telephone Encounter (Signed)
 Attempt to contact pt to discuss why she did not go to MAU as recommended after OB visit on 9/23 -  No answer, LVM to call office.  Also called late afternoon of 9/23 and no answer at contact # in chart.

## 2023-11-24 ENCOUNTER — Encounter: Admitting: Obstetrics and Gynecology

## 2023-11-28 ENCOUNTER — Telehealth: Payer: Self-pay | Admitting: Obstetrics and Gynecology

## 2023-11-28 NOTE — Telephone Encounter (Signed)
 Telephone call to patient to let her know we have added an OB visit on for tomorrow at 10:55 with Dr. Abigail.  Explained importance of visit, since she has missed her last OB visit.  Patient stated she would not be able to make that appointment.   Instructed patient to plan for admission following her ultrasound.  Per MFM instruction.  Patient stated she was feeling fine and is able to check her BP at home and is taking her meds and would decline admission.   Patient did confirm she would keep her 3pm MFM appointment for BPP.   Call completed via [employer].

## 2023-11-29 ENCOUNTER — Other Ambulatory Visit: Payer: Self-pay | Admitting: Obstetrics and Gynecology

## 2023-11-29 ENCOUNTER — Ambulatory Visit: Attending: Internal Medicine

## 2023-11-29 ENCOUNTER — Ambulatory Visit

## 2023-11-29 ENCOUNTER — Encounter: Admitting: Obstetrics and Gynecology

## 2023-11-29 ENCOUNTER — Telehealth: Payer: Self-pay

## 2023-11-29 DIAGNOSIS — O0990 Supervision of high risk pregnancy, unspecified, unspecified trimester: Secondary | ICD-10-CM

## 2023-11-29 DIAGNOSIS — O30003 Twin pregnancy, unspecified number of placenta and unspecified number of amniotic sacs, third trimester: Secondary | ICD-10-CM

## 2023-11-29 DIAGNOSIS — O10919 Unspecified pre-existing hypertension complicating pregnancy, unspecified trimester: Secondary | ICD-10-CM

## 2023-11-29 NOTE — Telephone Encounter (Signed)
 Attempted TC to pt with interpreter regarding MFM recommendations for direct admission today preferably before appointment. Pt did not answer, but a VM was left with recommendations and a callback number.

## 2023-12-12 ENCOUNTER — Encounter (HOSPITAL_COMMUNITY): Payer: Self-pay | Admitting: Obstetrics & Gynecology

## 2023-12-12 ENCOUNTER — Inpatient Hospital Stay (HOSPITAL_COMMUNITY): Admitting: Anesthesiology

## 2023-12-12 ENCOUNTER — Other Ambulatory Visit: Payer: Self-pay

## 2023-12-12 ENCOUNTER — Inpatient Hospital Stay (HOSPITAL_COMMUNITY)
Admission: AD | Admit: 2023-12-12 | Discharge: 2023-12-15 | DRG: 788 | Disposition: A | Discharge status: Home / Self Care | Attending: Obstetrics and Gynecology | Admitting: Obstetrics and Gynecology

## 2023-12-12 ENCOUNTER — Encounter (HOSPITAL_COMMUNITY)
Admission: AD | Disposition: A | Discharge status: Home / Self Care | Payer: Self-pay | Source: Home / Self Care | Attending: Obstetrics and Gynecology

## 2023-12-12 DIAGNOSIS — O10919 Unspecified pre-existing hypertension complicating pregnancy, unspecified trimester: Secondary | ICD-10-CM | POA: Diagnosis present

## 2023-12-12 DIAGNOSIS — O114 Pre-existing hypertension with pre-eclampsia, complicating childbirth: Principal | ICD-10-CM | POA: Diagnosis present

## 2023-12-12 DIAGNOSIS — Z98891 History of uterine scar from previous surgery: Secondary | ICD-10-CM

## 2023-12-12 DIAGNOSIS — O0930 Supervision of pregnancy with insufficient antenatal care, unspecified trimester: Secondary | ICD-10-CM

## 2023-12-12 DIAGNOSIS — Z6791 Unspecified blood type, Rh negative: Secondary | ICD-10-CM

## 2023-12-12 DIAGNOSIS — Z148 Genetic carrier of other disease: Secondary | ICD-10-CM

## 2023-12-12 DIAGNOSIS — O30043 Twin pregnancy, dichorionic/diamniotic, third trimester: Secondary | ICD-10-CM | POA: Diagnosis present

## 2023-12-12 DIAGNOSIS — O1092 Unspecified pre-existing hypertension complicating childbirth: Secondary | ICD-10-CM | POA: Diagnosis present

## 2023-12-12 DIAGNOSIS — O0990 Supervision of high risk pregnancy, unspecified, unspecified trimester: Principal | ICD-10-CM

## 2023-12-12 DIAGNOSIS — O26893 Other specified pregnancy related conditions, third trimester: Secondary | ICD-10-CM | POA: Diagnosis present

## 2023-12-12 DIAGNOSIS — O321XX2 Maternal care for breech presentation, fetus 2: Secondary | ICD-10-CM

## 2023-12-12 DIAGNOSIS — O1414 Severe pre-eclampsia complicating childbirth: Secondary | ICD-10-CM

## 2023-12-12 DIAGNOSIS — Z3A38 38 weeks gestation of pregnancy: Secondary | ICD-10-CM | POA: Diagnosis not present

## 2023-12-12 DIAGNOSIS — O321XX1 Maternal care for breech presentation, fetus 1: Secondary | ICD-10-CM | POA: Diagnosis present

## 2023-12-12 DIAGNOSIS — O26899 Other specified pregnancy related conditions, unspecified trimester: Secondary | ICD-10-CM

## 2023-12-12 DIAGNOSIS — O321XX0 Maternal care for breech presentation, not applicable or unspecified: Secondary | ICD-10-CM

## 2023-12-12 DIAGNOSIS — O30009 Twin pregnancy, unspecified number of placenta and unspecified number of amniotic sacs, unspecified trimester: Secondary | ICD-10-CM | POA: Diagnosis present

## 2023-12-12 DIAGNOSIS — Z603 Acculturation difficulty: Secondary | ICD-10-CM | POA: Diagnosis present

## 2023-12-12 LAB — CBC
HCT: 34.2 % — ABNORMAL LOW (ref 36.0–46.0)
Hemoglobin: 11.4 g/dL — ABNORMAL LOW (ref 12.0–15.0)
MCH: 29.1 pg (ref 26.0–34.0)
MCHC: 33.3 g/dL (ref 30.0–36.0)
MCV: 87.2 fL (ref 80.0–100.0)
Platelets: 118 K/uL — ABNORMAL LOW (ref 150–400)
RBC: 3.92 MIL/uL (ref 3.87–5.11)
RDW: 14.7 % (ref 11.5–15.5)
WBC: 5.6 K/uL (ref 4.0–10.5)
nRBC: 0 % (ref 0.0–0.2)

## 2023-12-12 LAB — COMPREHENSIVE METABOLIC PANEL WITH GFR
ALT: 17 U/L (ref 0–44)
AST: 33 U/L (ref 15–41)
Albumin: 2.2 g/dL — ABNORMAL LOW (ref 3.5–5.0)
Alkaline Phosphatase: 444 U/L — ABNORMAL HIGH (ref 38–126)
Anion gap: 10 (ref 5–15)
BUN: 8 mg/dL (ref 6–20)
CO2: 20 mmol/L — ABNORMAL LOW (ref 22–32)
Calcium: 8.5 mg/dL — ABNORMAL LOW (ref 8.9–10.3)
Chloride: 107 mmol/L (ref 98–111)
Creatinine, Ser: 0.91 mg/dL (ref 0.44–1.00)
GFR, Estimated: >60 mL/min (ref >60)
Glucose, Bld: 84 mg/dL (ref 70–99)
Potassium: 3.7 mmol/L (ref 3.5–5.1)
Sodium: 137 mmol/L (ref 135–145)
Total Bilirubin: 0.7 mg/dL (ref 0.0–1.2)
Total Protein: 6.5 g/dL (ref 6.5–8.1)

## 2023-12-12 LAB — PROTEIN / CREATININE RATIO, URINE
Creatinine, Urine: 61 mg/dL
Protein Creatinine Ratio: 0.67 mg/mg{creat} — ABNORMAL HIGH (ref 0.00–0.15)
Total Protein, Urine: 41 mg/dL

## 2023-12-12 SURGERY — Surgical Case
Anesthesia: Spinal | Site: Abdomen

## 2023-12-12 MED ORDER — ACETAMINOPHEN 10 MG/ML IV SOLN
INTRAVENOUS | Status: DC | PRN
  Administered 2023-12-12: 1000 mg via INTRAVENOUS

## 2023-12-12 MED ORDER — DEXAMETHASONE SOD PHOSPHATE PF 10 MG/ML IJ SOLN
INTRAMUSCULAR | Status: DC | PRN
  Administered 2023-12-12: 10 mg via INTRAVENOUS

## 2023-12-12 MED ORDER — CEFAZOLIN SODIUM-DEXTROSE 2-4 GM/100ML-% IV SOLN
2.0000 g | INTRAVENOUS | Status: DC
  Filled 2023-12-12: qty 100

## 2023-12-12 MED ORDER — IBUPROFEN 600 MG PO TABS
600.0000 mg | ORAL_TABLET | Freq: Four times a day (QID) | ORAL | Status: DC
  Administered 2023-12-13 – 2023-12-15 (×10): 600 mg via ORAL
  Filled 2023-12-12 (×10): qty 1

## 2023-12-12 MED ORDER — CEFAZOLIN SODIUM-DEXTROSE 2-3 GM-%(50ML) IV SOLR
INTRAVENOUS | Status: DC | PRN
  Administered 2023-12-12: 2 g via INTRAVENOUS

## 2023-12-12 MED ORDER — MEPERIDINE HCL 25 MG/ML IJ SOLN: 6.2500 mg | INTRAMUSCULAR | Status: DC | PRN

## 2023-12-12 MED ORDER — PROPOFOL 10 MG/ML IV BOLUS
INTRAVENOUS | Status: AC
  Filled 2023-12-12: qty 20

## 2023-12-12 MED ORDER — SODIUM CHLORIDE 0.9% FLUSH: 3.0000 mL | INTRAVENOUS | Status: DC | PRN

## 2023-12-12 MED ORDER — STERILE WATER FOR IRRIGATION IR SOLN
Status: DC | PRN
  Administered 2023-12-12: 1000 mL

## 2023-12-12 MED ORDER — ONDANSETRON HCL 4 MG/2ML IJ SOLN
INTRAMUSCULAR | Status: AC
  Filled 2023-12-12: qty 2

## 2023-12-12 MED ORDER — SIMETHICONE 80 MG PO CHEW
80.0000 mg | CHEWABLE_TABLET | Freq: Three times a day (TID) | ORAL | Status: DC
Start: 2023-12-13 — End: 2023-12-15
  Administered 2023-12-13 – 2023-12-15 (×8): 80 mg via ORAL
  Filled 2023-12-12 (×8): qty 1

## 2023-12-12 MED ORDER — LABETALOL HCL 5 MG/ML IV SOLN
INTRAVENOUS | Status: AC
  Filled 2023-12-12: qty 8

## 2023-12-12 MED ORDER — GABAPENTIN 100 MG PO CAPS
100.0000 mg | ORAL_CAPSULE | Freq: Every day | ORAL | Status: DC
  Administered 2023-12-12 – 2023-12-14 (×3): 100 mg via ORAL
  Filled 2023-12-12 (×3): qty 1

## 2023-12-12 MED ORDER — OXYCODONE-ACETAMINOPHEN 5-325 MG PO TABS
1.0000 | ORAL_TABLET | ORAL | Status: DC | PRN
  Administered 2023-12-14 – 2023-12-15 (×4): 1 via ORAL
  Filled 2023-12-12 (×4): qty 1

## 2023-12-12 MED ORDER — LABETALOL HCL 5 MG/ML IV SOLN: 40.0000 mg | INTRAVENOUS | Status: DC | PRN

## 2023-12-12 MED ORDER — COCONUT OIL OIL
1.0000 | TOPICAL_OIL | Status: DC | PRN
  Administered 2023-12-13: 1 via TOPICAL

## 2023-12-12 MED ORDER — SODIUM CHLORIDE 0.9 % IR SOLN
Status: DC | PRN
  Administered 2023-12-12: 1

## 2023-12-12 MED ORDER — OXYTOCIN-SODIUM CHLORIDE 30-0.9 UT/500ML-% IV SOLN
INTRAVENOUS | Status: DC | PRN
  Administered 2023-12-12: 300 mL via INTRAVENOUS

## 2023-12-12 MED ORDER — SENNOSIDES-DOCUSATE SODIUM 8.6-50 MG PO TABS
2.0000 | ORAL_TABLET | Freq: Every day | ORAL | Status: DC
  Administered 2023-12-13 – 2023-12-15 (×3): 2 via ORAL
  Filled 2023-12-12 (×3): qty 2

## 2023-12-12 MED ORDER — LACTATED RINGERS IV SOLN: INTRAVENOUS | Status: AC

## 2023-12-12 MED ORDER — WITCH HAZEL-GLYCERIN EX PADS: 1.0000 | MEDICATED_PAD | CUTANEOUS | Status: DC | PRN

## 2023-12-12 MED ORDER — KETOROLAC TROMETHAMINE 30 MG/ML IJ SOLN
30.0000 mg | Freq: Once | INTRAMUSCULAR | Status: AC | PRN
  Administered 2023-12-12: 30 mg via INTRAVENOUS

## 2023-12-12 MED ORDER — LABETALOL HCL 5 MG/ML IV SOLN
40.0000 mg | INTRAVENOUS | Status: DC | PRN
  Administered 2023-12-12 (×2): 40 mg via INTRAVENOUS
  Filled 2023-12-12: qty 8

## 2023-12-12 MED ORDER — PHENYLEPHRINE 80 MCG/ML (10ML) SYRINGE FOR IV PUSH (FOR BLOOD PRESSURE SUPPORT)
PREFILLED_SYRINGE | INTRAVENOUS | Status: AC
  Filled 2023-12-12: qty 10

## 2023-12-12 MED ORDER — ONDANSETRON HCL 4 MG/2ML IJ SOLN: 4.0000 mg | Freq: Three times a day (TID) | INTRAMUSCULAR | Status: DC | PRN

## 2023-12-12 MED ORDER — MENTHOL 3 MG MT LOZG: 1.0000 | LOZENGE | OROMUCOSAL | Status: DC | PRN

## 2023-12-12 MED ORDER — SCOPOLAMINE 1 MG/3DAYS TD PT72: 1.0000 | MEDICATED_PATCH | Freq: Once | TRANSDERMAL | Status: DC

## 2023-12-12 MED ORDER — ACETAMINOPHEN 500 MG PO TABS
1000.0000 mg | ORAL_TABLET | Freq: Four times a day (QID) | ORAL | Status: AC
  Administered 2023-12-13 (×4): 1000 mg via ORAL
  Filled 2023-12-12 (×4): qty 2

## 2023-12-12 MED ORDER — NIFEDIPINE ER OSMOTIC RELEASE 30 MG PO TB24
30.0000 mg | ORAL_TABLET | Freq: Every day | ORAL | Status: DC
  Administered 2023-12-13 – 2023-12-15 (×3): 30 mg via ORAL
  Filled 2023-12-12 (×3): qty 1

## 2023-12-12 MED ORDER — PHENYLEPHRINE HCL-NACL 20-0.9 MG/250ML-% IV SOLN
INTRAVENOUS | Status: AC
  Filled 2023-12-12: qty 500

## 2023-12-12 MED ORDER — LACTATED RINGERS IV SOLN: INTRAVENOUS | Status: DC | PRN

## 2023-12-12 MED ORDER — HYDRALAZINE HCL 20 MG/ML IJ SOLN: 10.0000 mg | INTRAMUSCULAR | Status: DC | PRN

## 2023-12-12 MED ORDER — PHENYLEPHRINE HCL-NACL 20-0.9 MG/250ML-% IV SOLN
INTRAVENOUS | Status: DC | PRN
  Administered 2023-12-12: 60 ug/min via INTRAVENOUS

## 2023-12-12 MED ORDER — KETOROLAC TROMETHAMINE 30 MG/ML IJ SOLN
INTRAMUSCULAR | Status: AC
  Filled 2023-12-12: qty 1

## 2023-12-12 MED ORDER — LABETALOL HCL 5 MG/ML IV SOLN: 80.0000 mg | INTRAVENOUS | Status: DC | PRN

## 2023-12-12 MED ORDER — OXYCODONE HCL 5 MG PO TABS: 5.0000 mg | ORAL_TABLET | Freq: Once | ORAL | Status: DC | PRN

## 2023-12-12 MED ORDER — ONDANSETRON HCL 4 MG/2ML IJ SOLN
INTRAMUSCULAR | Status: DC | PRN
  Administered 2023-12-12: 4 mg via INTRAVENOUS

## 2023-12-12 MED ORDER — LABETALOL HCL 5 MG/ML IV SOLN: 20.0000 mg | INTRAVENOUS | Status: DC | PRN

## 2023-12-12 MED ORDER — DIPHENHYDRAMINE HCL 50 MG/ML IJ SOLN: 12.5000 mg | INTRAMUSCULAR | Status: DC | PRN

## 2023-12-12 MED ORDER — OXYTOCIN-SODIUM CHLORIDE 30-0.9 UT/500ML-% IV SOLN
2.5000 [IU]/h | INTRAVENOUS | Status: AC
Start: 2023-12-13 — End: 2023-12-13

## 2023-12-12 MED ORDER — AMISULPRIDE (ANTIEMETIC) 5 MG/2ML IV SOLN: 10.0000 mg | Freq: Once | INTRAVENOUS | Status: DC | PRN

## 2023-12-12 MED ORDER — FENTANYL CITRATE (PF) 100 MCG/2ML IJ SOLN
INTRAMUSCULAR | Status: AC
  Filled 2023-12-12: qty 2

## 2023-12-12 MED ORDER — ONDANSETRON HCL 4 MG/2ML IJ SOLN: 4.0000 mg | Freq: Once | INTRAMUSCULAR | Status: DC | PRN

## 2023-12-12 MED ORDER — MAGNESIUM SULFATE 40 GM/1000ML IV SOLN
2.0000 g/h | INTRAVENOUS | Status: AC
  Administered 2023-12-13: 2 g/h via INTRAVENOUS
  Filled 2023-12-12: qty 1000

## 2023-12-12 MED ORDER — OXYCODONE HCL 5 MG/5ML PO SOLN: 5.0000 mg | Freq: Once | ORAL | Status: DC | PRN

## 2023-12-12 MED ORDER — LABETALOL HCL 5 MG/ML IV SOLN
INTRAVENOUS | Status: AC
  Filled 2023-12-12: qty 4

## 2023-12-12 MED ORDER — KETOROLAC TROMETHAMINE 30 MG/ML IJ SOLN: 30.0000 mg | Freq: Four times a day (QID) | INTRAMUSCULAR | Status: AC | PRN

## 2023-12-12 MED ORDER — ENOXAPARIN SODIUM 60 MG/0.6ML IJ SOSY
45.0000 mg | PREFILLED_SYRINGE | INTRAMUSCULAR | Status: DC
Start: 2023-12-13 — End: 2023-12-15
  Administered 2023-12-13 – 2023-12-15 (×3): 45 mg via SUBCUTANEOUS
  Filled 2023-12-12 (×3): qty 0.6

## 2023-12-12 MED ORDER — BUPIVACAINE IN DEXTROSE 0.75-8.25 % IT SOLN
INTRATHECAL | Status: DC | PRN
Start: 2023-12-12 — End: 2023-12-12
  Administered 2023-12-12: 1.4 mL via INTRATHECAL

## 2023-12-12 MED ORDER — HYDRALAZINE HCL 20 MG/ML IJ SOLN
10.0000 mg | INTRAMUSCULAR | Status: DC | PRN
  Administered 2023-12-12: 10 mg via INTRAVENOUS
  Filled 2023-12-12: qty 1

## 2023-12-12 MED ORDER — OXYTOCIN-SODIUM CHLORIDE 30-0.9 UT/500ML-% IV SOLN
INTRAVENOUS | Status: AC
  Filled 2023-12-12: qty 500

## 2023-12-12 MED ORDER — TRANEXAMIC ACID-NACL 1000-0.7 MG/100ML-% IV SOLN
1000.0000 mg | Freq: Once | INTRAVENOUS | Status: AC
  Administered 2023-12-12: 1000 mg via INTRAVENOUS

## 2023-12-12 MED ORDER — MORPHINE SULFATE (PF) 0.5 MG/ML IJ SOLN
INTRAMUSCULAR | Status: AC
  Filled 2023-12-12: qty 10

## 2023-12-12 MED ORDER — DIBUCAINE (PERIANAL) 1 % EX OINT: 1.0000 | TOPICAL_OINTMENT | CUTANEOUS | Status: DC | PRN

## 2023-12-12 MED ORDER — HYDROMORPHONE HCL 1 MG/ML IJ SOLN: 0.2500 mg | INTRAMUSCULAR | Status: DC | PRN

## 2023-12-12 MED ORDER — LABETALOL HCL 5 MG/ML IV SOLN
20.0000 mg | INTRAVENOUS | Status: DC | PRN
  Administered 2023-12-12 (×2): 20 mg via INTRAVENOUS
  Filled 2023-12-12: qty 4

## 2023-12-12 MED ORDER — DIPHENHYDRAMINE HCL 25 MG PO CAPS: 25.0000 mg | ORAL_CAPSULE | Freq: Four times a day (QID) | ORAL | Status: DC | PRN

## 2023-12-12 MED ORDER — LABETALOL HCL 5 MG/ML IV SOLN
80.0000 mg | INTRAVENOUS | Status: DC | PRN
  Administered 2023-12-12: 80 mg via INTRAVENOUS
  Filled 2023-12-12: qty 16

## 2023-12-12 MED ORDER — FENTANYL CITRATE (PF) 100 MCG/2ML IJ SOLN
INTRAMUSCULAR | Status: DC | PRN
  Administered 2023-12-12: 15 ug via INTRATHECAL

## 2023-12-12 MED ORDER — SOD CITRATE-CITRIC ACID 500-334 MG/5ML PO SOLN
30.0000 mL | ORAL | Status: AC
  Administered 2023-12-12: 30 mL via ORAL
  Filled 2023-12-12: qty 30

## 2023-12-12 MED ORDER — PRENATAL MULTIVITAMIN CH
1.0000 | ORAL_TABLET | Freq: Every day | ORAL | Status: DC
  Administered 2023-12-13 – 2023-12-15 (×3): 1 via ORAL
  Filled 2023-12-12 (×3): qty 1

## 2023-12-12 MED ORDER — MORPHINE SULFATE (PF) 0.5 MG/ML IJ SOLN
INTRAMUSCULAR | Status: DC | PRN
  Administered 2023-12-12: 150 ug via INTRATHECAL

## 2023-12-12 MED ORDER — MAGNESIUM SULFATE BOLUS VIA INFUSION
6.0000 g | Freq: Once | INTRAVENOUS | Status: AC
  Administered 2023-12-12: 6 g via INTRAVENOUS
  Filled 2023-12-12: qty 1000

## 2023-12-12 MED ORDER — DIPHENHYDRAMINE HCL 25 MG PO CAPS: 25.0000 mg | ORAL_CAPSULE | ORAL | Status: DC | PRN

## 2023-12-12 MED ORDER — SIMETHICONE 80 MG PO CHEW
80.0000 mg | CHEWABLE_TABLET | ORAL | Status: DC | PRN
  Administered 2023-12-14: 80 mg via ORAL
  Filled 2023-12-12: qty 1

## 2023-12-12 MED ORDER — NALOXONE HCL 0.4 MG/ML IJ SOLN: 0.4000 mg | INTRAMUSCULAR | Status: DC | PRN

## 2023-12-12 MED ORDER — NALOXONE HCL 4 MG/10ML IJ SOLN: 1.0000 ug/kg/h | INTRAVENOUS | Status: DC | PRN

## 2023-12-12 MED ORDER — MAGNESIUM SULFATE 40 GM/1000ML IV SOLN
2.0000 g/h | INTRAVENOUS | Status: DC
  Administered 2023-12-12: 2 g/h via INTRAVENOUS
  Filled 2023-12-12: qty 1000

## 2023-12-12 SURGICAL SUPPLY — 31 items
BENZOIN TINCTURE PRP APPL 2/3 (GAUZE/BANDAGES/DRESSINGS) IMPLANT
CHLORAPREP W/TINT 26 (MISCELLANEOUS) ×2 IMPLANT
CLAMP UMBILICAL CORD (MISCELLANEOUS) ×1 IMPLANT
CLOTH BEACON ORANGE TIMEOUT ST (SAFETY) ×1 IMPLANT
DRSG OPSITE POSTOP 4X10 (GAUZE/BANDAGES/DRESSINGS) ×1 IMPLANT
ELECTRODE REM PT RTRN 9FT ADLT (ELECTROSURGICAL) ×1 IMPLANT
EXTRACTOR VACUUM M CUP 4 TUBE (SUCTIONS) IMPLANT
GAUZE PAD ABD 7.5X8 STRL (GAUZE/BANDAGES/DRESSINGS) IMPLANT
GAUZE SPONGE 4X4 12PLY STRL LF (GAUZE/BANDAGES/DRESSINGS) IMPLANT
GLOVE BIO SURGEON STRL SZ7.5 (GLOVE) ×1 IMPLANT
GLOVE BIOGEL PI IND STRL 7.0 (GLOVE) ×2 IMPLANT
GLOVE BIOGEL PI IND STRL 8 (GLOVE) ×1 IMPLANT
GOWN STRL REUS W/TWL LRG LVL3 (GOWN DISPOSABLE) ×2 IMPLANT
GOWN STRL REUS W/TWL XL LVL3 (GOWN DISPOSABLE) ×1 IMPLANT
KIT ABG SYR 3ML LUER SLIP (SYRINGE) IMPLANT
MAT PREVALON FULL STRYKER (MISCELLANEOUS) IMPLANT
NDL HYPO 25X5/8 SAFETYGLIDE (NEEDLE) IMPLANT
NEEDLE HYPO 25X5/8 SAFETYGLIDE (NEEDLE) IMPLANT
NS IRRIG 1000ML POUR BTL (IV SOLUTION) ×1 IMPLANT
PACK C SECTION WH (CUSTOM PROCEDURE TRAY) ×1 IMPLANT
PAD OB MATERNITY 4.3X12.25 (PERSONAL CARE ITEMS) ×1 IMPLANT
RTRCTR C-SECT PINK 25CM LRG (MISCELLANEOUS) ×1 IMPLANT
STRIP CLOSURE SKIN 1/2X4 (GAUZE/BANDAGES/DRESSINGS) IMPLANT
SUT MNCRL 0 VIOLET CTX 36 (SUTURE) ×2 IMPLANT
SUT VIC AB 0 CTX36XBRD ANBCTRL (SUTURE) ×1 IMPLANT
SUT VIC AB 2-0 CT1 TAPERPNT 27 (SUTURE) ×1 IMPLANT
SUT VIC AB 4-0 KS 27 (SUTURE) ×1 IMPLANT
TAPE CLOTH SURG 4X10 WHT LF (GAUZE/BANDAGES/DRESSINGS) IMPLANT
TOWEL OR 17X24 6PK STRL BLUE (TOWEL DISPOSABLE) ×1 IMPLANT
TRAY FOLEY W/BAG SLVR 14FR LF (SET/KITS/TRAYS/PACK) ×1 IMPLANT
WATER STERILE IRR 1000ML POUR (IV SOLUTION) ×1 IMPLANT

## 2023-12-12 NOTE — Transfer of Care (Signed)
 Immediate Anesthesia Transfer of Care Note  Patient: Courtney Hodge  Procedure(s) Performed: CESAREAN SECTION, MULTIPLE FETUSES (Abdomen)  Patient Location: PACU  Anesthesia Type:Spinal  Level of Consciousness: awake, alert , and oriented  Airway & Oxygen Therapy: Patient Spontanous Breathing  Post-op Assessment: Report given to RN and Post -op Vital signs reviewed and stable  Post vital signs: Reviewed and stable  Last Vitals:  Vitals Value Taken Time  BP 113/75 12/12/23 21:23  Temp    Pulse 68 12/12/23 21:27  Resp 15 12/12/23 21:27  SpO2 100 % 12/12/23 21:27  Vitals shown include unfiled device data.  Last Pain:  Vitals:   12/12/23 1610  TempSrc: Oral         Complications: No notable events documented.

## 2023-12-12 NOTE — H&P (Signed)
 Faculty Practice H&P  Courtney Hodge is a 33 y.o. female H3E4994 with IUP at [redacted]w[redacted]d presenting for primary cesarean section for twin gestation, malpresentation of both fetuses, severe preeclampsia. Pregnancy was been complicated by severe preeclampsia.    Pt states she has been having no contractions,  vaginal bleeding, intact membranes, with normal fetal movement.     Prenatal Course Source of Care: [facility] with onset of care at 16 weeks  Pregnancy complications or risks: Patient Active Problem List   Diagnosis Date Noted   Grand multiparity with antenatal problem 08/04/2023   Late prenatal care affecting pregnancy 08/04/2023   Twin pregnancy 08/04/2023   Chronic hypertension during pregnancy, antepartum 08/04/2023   Carrier of spinal muscular atrophy-^ risk 08/04/2023   Supervision of high risk pregnancy, antepartum 06/29/2023   Rh negative state in antepartum period 02/19/2016   Language barrier, cultural differences 02/02/2016   She desires no method for contraception.  She plans to breastfeed  Prenatal labs and studies: ABO, Rh: --/--/PENDING (10/21 1301) Antibody: PENDING (10/21 1301) Rubella: 4.07 (05/08 1528) RPR: Non Reactive (08/12 0851)  HBsAg: Negative (05/08 1528)  HIV: Non Reactive (08/12 0851)  GBS:    2hr Glucola: negative Genetic screening: normal Anatomy US : normal  Past Medical History:  Past Medical History:  Diagnosis Date   HTN (hypertension)    Irregular menses 12/22/2021    Past Surgical History:  Past Surgical History:  Procedure Laterality Date   NO PAST SURGERIES      Obstetrical History:  OB History     Gravida  6   Para  5   Term  5   Preterm  0   AB  0   Living  5      SAB  0   IAB  0   Ectopic  0   Multiple  0   Live Births  5           Gynecological History:  OB History     Gravida  6   Para  5   Term  5   Preterm  0   AB  0   Living  5      SAB  0   IAB  0   Ectopic  0   Multiple  0    Live Births  5           Social History:  Social History   Socioeconomic History   Marital status: Married    Spouse name: Not on file   Number of children: 4   Years of education: Not on file   Highest education level: Not on file  Occupational History   Occupation: works at a [employer], quantity  Tobacco Use   Smoking status: Never   Smokeless tobacco: Never  Vaping Use   Vaping status: Never Used  Substance and Sexual Activity   Alcohol use: No   Drug use: No   Sexual activity: Not Currently    Partners: Male  Other Topics Concern   Not on file  Social History Narrative   ** Merged History Encounter **       From [employer], does not speak English   Social Drivers of Corporate investment banker Strain: Not on file  Food Insecurity: Not on file  Transportation Needs: Not on file  Physical Activity: Not on file  Stress: Not on file  Social Connections: Not on file    Family History:  Family History  Problem Relation Age  of Onset   Healthy Mother    Healthy Father    Asthma Neg Hx    Cancer Neg Hx    Diabetes Neg Hx    Heart disease Neg Hx    Hypertension Neg Hx     Medications:  Prenatal vitamins,  Current Facility-Administered Medications  Medication Dose Route Frequency Provider Last Rate Last Admin   labetalol  (NORMODYNE ) injection 20 mg  20 mg Intravenous PRN Regino Credit A, CNM   20 mg at 12/12/23 1324   And   labetalol  (NORMODYNE ) injection 40 mg  40 mg Intravenous PRN Regino Credit A, CNM   40 mg at 12/12/23 1342   And   labetalol  (NORMODYNE ) injection 80 mg  80 mg Intravenous PRN Regino Credit A, CNM   80 mg at 12/12/23 1420   And   hydrALAZINE (APRESOLINE) injection 10 mg  10 mg Intravenous PRN [place], Credit LABOR, CNM       labetalol  (NORMODYNE ) 5 MG/ML injection            labetalol  (NORMODYNE ) 5 MG/ML injection             Allergies: No Known Allergies  Review of Systems: - Negative  Physical Exam: Blood pressure (!)  155/99, pulse 73, height 5' 6 (1.676 m), weight 90.7 kg, last menstrual period 03/16/2023, unknown if currently breastfeeding. GENERAL: Well-developed, well-nourished female in no acute distress.  LUNGS: Clear to auscultation bilaterally.  HEART: Regular rate and rhythm. ABDOMEN: Soft, nontender, nondistended, gravid.  Presentation: Breech back up both fetuses   Pertinent Labs/Studies:   Lab Results  Component Value Date   WBC 5.6 12/12/2023   HGB 11.4 (L) 12/12/2023   HCT 34.2 (L) 12/12/2023   MCV 87.2 12/12/2023   PLT 118 (L) 12/12/2023    Assessment : Courtney Hodge is a 33 y.o. H3E4994 at [redacted]w[redacted]d being admitted for cesarean section secondary to breech presentation of twins, both breech, severe preeclampsia.  Plan: The risks of cesarean section discussed with the patient included but were not limited to: bleeding which may require transfusion or reoperation; infection which may require antibiotics; injury to bowel, bladder, ureters or other surrounding organs; injury to the fetus; need for additional procedures including hysterectomy in the event of a life-threatening hemorrhage; placental abnormalities wth subsequent pregnancies, incisional problems, thromboembolic phenomenon and other postoperative/anesthesia complications. The patient concurred with the proposed plan, giving informed written consent for the procedure.   Patient has been NPO since 11 AM and will remain NPO for procedure.  Preoperative prophylactic Ancef ordered on call to the OR.    Courtney Hodge V, MD 12/12/2023, 2:38 PM

## 2023-12-12 NOTE — Anesthesia Postprocedure Evaluation (Signed)
 Anesthesia Post Note  Patient: Courtney Hodge  Procedure(s) Performed: CESAREAN SECTION, MULTIPLE FETUSES (Abdomen)     Patient location during evaluation: PACU Anesthesia Type: Spinal Level of consciousness: awake and alert and oriented Pain management: pain level controlled Vital Signs Assessment: post-procedure vital signs reviewed and stable Respiratory status: spontaneous breathing, nonlabored ventilation and respiratory function stable Cardiovascular status: blood pressure returned to baseline and stable Postop Assessment: no headache, no backache, spinal receding and no apparent nausea or vomiting Anesthetic complications: no   No notable events documented.  Last Vitals:  Vitals:   12/12/23 2205 12/12/23 2218  BP: (!) 155/82 (!) 169/83  Pulse: 64 66  Resp: 19 13  Temp:  37.3 C  SpO2: 99% 99%    Last Pain:  Vitals:   12/12/23 2156  TempSrc:   PainSc: 0-No pain   Pain Goal:    LLE Motor Response: Purposeful movement (12/12/23 2220) LLE Sensation: Decreased (12/12/23 2220) RLE Motor Response: Purposeful movement (12/12/23 2220) RLE Sensation: Decreased (12/12/23 2220) L Sensory Level: T12-Inguinal (groin) region (12/12/23 2220) R Sensory Level: T12-Inguinal (groin) region (12/12/23 2220) Epidural/Spinal Function Cutaneous sensation: Able to Wiggle Toes (12/12/23 2220), Patient able to flex knees: Yes (12/12/23 2220), Patient able to lift hips off bed: No (12/12/23 2220), Back pain beyond tenderness at insertion site: No (12/12/23 2220), Progressively worsening motor and/or sensory loss: No (12/12/23 2220), Bowel and/or bladder incontinence post epidural: No (12/12/23 2220)  [name]

## 2023-12-12 NOTE — MAU Note (Signed)
 Courtney Hodge is a 33 y.o. at [redacted]w[redacted]d here in MAU reporting: no complaints- just want the babies checked-- She reports +FM by 2s, No LOF, no VB, no blurry vision, headaches, peripheral edema, or RUQ pain. No office visit since sept and the office could not get me in. Dr Ozan on unit notified of no complaints but to keep NPO and MD to come and see.   LMP: - Onset of complaint: none Pain score: 0/10 Vitals:   12/12/23 1231  BP: (!) 168/105  Pulse: 89     FHT: triage bypassed- to room  Lab orders placed from triage: npo

## 2023-12-12 NOTE — Anesthesia Procedure Notes (Signed)
 Spinal  Patient location during procedure: OR Start time: 12/12/2023 7:43 PM End time: 12/12/2023 7:46 PM Reason for block: surgical anesthesia Staffing Performed: anesthesiologist  Anesthesiologist: Merla Almarie HERO, DO Performed by: Merla Almarie HERO, DO Authorized by: Merla Almarie HERO, DO   Preanesthetic Checklist Completed: patient identified, IV checked, risks and benefits discussed, surgical consent, monitors and equipment checked, pre-op evaluation and timeout performed Spinal Block Patient position: sitting Prep: DuraPrep and site prepped and draped Patient monitoring: cardiac monitor, continuous pulse ox and blood pressure Approach: midline Location: L3-4 Injection technique: single-shot Needle Needle type: Pencan  Needle gauge: 24 G Needle length: 9 cm Assessment Sensory level: T6 Events: CSF return Additional Notes Functioning IV was confirmed and monitors were applied. Sterile prep and drape, including hand hygiene and sterile gloves were used. The patient was positioned and the spine was prepped. The skin was anesthetized with lidocaine .  Free flow of clear CSF was obtained prior to injecting local anesthetic into the CSF.  The spinal needle aspirated freely following injection.  The needle was carefully withdrawn.  The patient tolerated the procedure well.

## 2023-12-12 NOTE — Anesthesia Preprocedure Evaluation (Addendum)
 Anesthesia Evaluation  Patient identified by MRN, date of birth, ID band Patient awake  General Assessment Comment:Pt ate biscuits and avacado at 1100  Reviewed: Allergy & Precautions, NPO status , Patient's Chart, lab work & pertinent test results  History of Anesthesia Complications Negative for: history of anesthetic complications  Airway Mallampati: II  TM Distance: >3 FB Neck ROM: Full    Dental no notable dental hx. (+) Teeth Intact, Dental Advisory Given   Pulmonary neg pulmonary ROS   Pulmonary exam normal breath sounds clear to auscultation       Cardiovascular hypertension (Pre E), Pt. on medications and Pt. on home beta blockers (-) angina (-) Past MI Normal cardiovascular exam Rhythm:Regular Rate:Normal     Neuro/Psych negative neurological ROS  negative psych ROS   GI/Hepatic negative GI ROS, Neg liver ROS,,,  Endo/Other    Renal/GU negative Renal ROS     Musculoskeletal   Abdominal   Peds  Hematology Lab Results      Component                Value               Date                      WBC                      5.6                 12/12/2023                HGB                      11.4 (L)            12/12/2023                HCT                      34.2 (L)            12/12/2023                MCV                      87.2                12/12/2023                PLT                      118 (L)             12/12/2023              Anesthesia Other Findings   Reproductive/Obstetrics (+) Pregnancy Breech Twins                              Anesthesia Physical Anesthesia Plan  ASA: 3  Anesthesia Plan: Spinal   Post-op Pain Management: Ofirmev  IV (intra-op)*, Precedex and Dilaudid IV   Induction: Cricoid pressure planned, Rapid sequence and Intravenous  PONV Risk Score and Plan: 4 or greater and Treatment may vary due to age or medical condition and  Ondansetron   Airway Management Planned: Nasal Cannula and Natural Airway  Additional Equipment: None  Intra-op Plan:   Post-operative Plan:  Informed Consent:      Dental advisory given and Interpreter used for interview  Plan Discussed with: CRNA and Surgeon  Anesthesia Plan Comments: (38.5 wk G6P5 w severe pre E  for twins c/S swaheili )         Anesthesia Quick Evaluation

## 2023-12-12 NOTE — Op Note (Signed)
 Courtney Hodge PROCEDURE DATE: 12/12/2023  PREOPERATIVE DIAGNOSIS: Intrauterine pregnancy at  [redacted]w[redacted]d weeks gestation with twin gestation; malpresentation: breech/breech and severe preeclampsia  POSTOPERATIVE DIAGNOSIS: The same  PROCEDURE:     Cesarean Section  SURGEON:  Dr. Winton Felt  ASSISTANT: Dr. EMERSON Maier  An experienced assistant was required given the standard of surgical care given the complexity of the case.  This assistant was needed for exposure, dissection, suctioning, retraction, instrument exchange, assisting with delivery with administration of fundal pressure, and for overall help during the procedure.  INDICATIONS: Courtney Hodge is a 33 y.o. H3E3992 at [redacted]w[redacted]d with di-di twin scheduled for cesarean section secondary to malpresentation: breech/breech and severe preeclampsia.  The risks of cesarean section discussed with the patient included but were not limited to: bleeding which may require transfusion or reoperation; infection which may require antibiotics; injury to bowel, bladder, ureters or other surrounding organs; injury to the fetus; need for additional procedures including hysterectomy in the event of a life-threatening hemorrhage; placental abnormalities wth subsequent pregnancies, incisional problems, thromboembolic phenomenon and other postoperative/anesthesia complications. The patient concurred with the proposed plan, giving informed written consent for the procedure.    FINDINGS:  Viable female and female infants in incomplete breech presentation.  Apgars 5 and 9/ Apgars 8 and 8 respectively.  Meconium stained amniotic fluid for twin A and clear fluid for twin B.  Intact placenta, three vessel cord.  Normal uterus, fallopian tubes and ovaries bilaterally.  ANESTHESIA:    Spinal INTRAVENOUS FLUIDS:1700 ml ESTIMATED BLOOD LOSS: 477 ml URINE OUTPUT:  100 ml SPECIMENS: Placenta sent to pathology COMPLICATIONS: None immediate  PROCEDURE IN DETAIL:  The patient received  intravenous antibiotics and had sequential compression devices applied to her lower extremities while in the preoperative area.  She was then taken to the operating room where anesthesia was induced and was found to be adequate. A foley catheter was placed into her bladder and attached to Courtney Hodge gravity. She was then placed in a dorsal supine position with a leftward tilt, and prepped and draped in a sterile manner. After an adequate timeout was performed, a Pfannenstiel skin incision was made with scalpel and carried through to the underlying layer of fascia. The fascia was incised in the midline and this incision was extended bilaterally bluntly. The rectus muscles were separated in the midline bluntly and the peritoneum was entered bluntly. The Alexis self-retaining retractor was introduced into the abdominal cavity. Attention was turned to the lower uterine segment where a transverse hysterotomy was made with a scalpel and extended bilaterally bluntly. The infant was successfully delivered. Delayed cord clamping was not performed and the cord was clamped and cut and infants were handed over to awaiting neonatology team. Uterine massage was then administered and the placenta delivered intact with three-vessel cord x 2. The uterus was cleared of clot and debris.  The hysterotomy was closed with 0 Vicryl in a running locked fashion, and an imbricating layer was also placed with a 0 Vicryl. Overall, excellent hemostasis was noted. The pelvis was cleared of all clot and debris. Hemostasis was confirmed on all surfaces.  The peritoneum and the muscles were reapproximated using 0 vicryl interrupted stitches. The fascia was then closed using 0 Vicryl in a running fashion.  The skin was closed in a subcuticular fashion using 3.0 Vicryl. The patient tolerated the procedure well. Sponge, lap, instrument and needle counts were correct x 2. She was taken to the recovery room in stable condition.    Courtney Hodge  ConstantMD   12/12/2023 8:56 PM

## 2023-12-12 NOTE — Discharge Summary (Signed)
 Postpartum Discharge Summary  Date of Service updated: 12/15/23     Patient Name: Courtney Hodge DOB: 1990-12-25 MRN: [id number]  Date of admission: 12/12/2023 Delivery date:   Lylee, Corrow [[id number]]  12/12/2023    Merrill, Deanda [[id number]]  12/12/2023 Delivering provider:    Velita, Quirk [[id number]]  CONSTANT, PEGGY    Enslee, Bibbins [[id number]]  CONSTANT, PEGGY  Date of discharge: 12/15/2023  Admitting diagnosis: Indication for care in labor or delivery [O75.9] Intrauterine pregnancy: [redacted]w[redacted]d     Secondary diagnosis:  Principal Problem:   Indication for care in labor or delivery Active Problems:   Language barrier, cultural differences   Rh negative state in antepartum period   Late prenatal care affecting pregnancy   Twin pregnancy   Chronic hypertension during pregnancy, antepartum   Carrier of spinal muscular atrophy-^ risk  Additional problems: fetal malpresentation of di/di twins    Discharge diagnosis: Term Pregnancy Delivered, Preeclampsia (severe), and di/di twins, fetal malpresentation                                              Post partum procedures:n/a Augmentation: N/A Complications: None  Hospital course: Sceduled C/S   33 y.o. yo H3E3992 at [redacted]w[redacted]d was admitted to the hospital 12/12/2023 and taken for cesarean section with the following indication:Malpresentation and Multifetal Gestation.Delivery details are as follows:  Membrane Rupture Time/Date:    Canda, Podgorski [[id number]]  8:12 PM    Jeydi, Klingel [[id number]]  8:12 PM,   Corrissa, Martello [[id number]]  12/12/2023    Apollonia, Amini [[id number]]  12/12/2023  Delivery Method:   Pick Wheeler Willena [[id number]]  C-Section, Low Transverse    Cherlynn, Popiel [[id number]]  C-Section, Low Transverse Operative Delivery:N/A Details of operation can be found in separate operative note.    Patient had a postpartum course complicated by severe preeclampsia and magnesium  recovery .  She is ambulating, tolerating a regular diet, passing flatus, and urinating well. Patient is discharged home in stable condition on  12/15/23        Newborn Data: Birth date:   Sylvan, Lahm [[id number]]  12/12/2023    Harsimran, Westman [[id number]]  12/12/2023 Birth time:   Shannen, Flansburg [[id number]]  8:15 PM    Pollie, Poma [[id number]]  8:17 PM Gender:   Shelaine, Frie [[id number]]  Female    Capria, Cartaya [[id number]]  Female Living status:   Retal, Tonkinson [[id number]]  Living    Shironda, Kain [[id number]]  Living Apgars:   Tineshia, Becraft [[id number]]  5    Coila, Wardell [[id number]]  8 ,   Elisheva, Fallas [[id number]]  9    Hutzler, BoyB Acelyn [[id number]]  8  Weight:   Vallorie, Niccoli [[id number]]  2510 g    Takeshia, Wenk [[id number]]  3147 g    Magnesium Sulfate received: Yes: Seizure prophylaxis BMZ received: No Rhophylac :Yes MMR:N/A T-DaP:n/a Flu: No RSV Vaccine received: No Transfusion:No  Immunizations received: Immunization History  Administered Date(s) Administered   Influenza,inj,Quad PF,6+ Mos 02/02/2016   Tdap 08/19/2015    Physical exam  Vitals:   12/14/23 2359 12/15/23 0536 12/15/23 0847 12/15/23 1244  BP: 123/72 (!) 156/95 (!) 147/83 (!) 148/89  Pulse: 67 65 69 76  Resp: 17 18 17 14   Temp:  98.5 F (36.9 C) 98.2 F (36.8 C) 98.3 F (36.8 C) 98.5 F (36.9 C)  TempSrc: Oral Oral Oral Oral  SpO2: 99% 100% 98% 99%  Weight:      Height:       General: alert, cooperative, and no distress Lochia: appropriate Uterine Fundus: firm Incision: Dressing is clean, dry, and intact DVT Evaluation: No evidence of DVT seen on physical exam. Negative Homan's sign.  Labs: Lab Results  Component Value Date   WBC 8.0 12/13/2023   HGB 9.8 (L) 12/13/2023   HCT 28.5 (L) 12/13/2023   MCV 87.7 12/13/2023   PLT 110 (L) 12/13/2023      Latest Ref Rng & Units 12/13/2023    4:23 AM  CMP   Creatinine 0.44 - 1.00 mg/dL 9.00    Edinburgh Score:     No data to display         No data recorded  After visit meds:  Allergies as of 12/15/2023   No Known Allergies      Medication List     STOP taking these medications    amoxicillin  500 MG capsule Commonly known as: AMOXIL    aspirin  EC 81 MG tablet   benzonatate  100 MG capsule Commonly known as: TESSALON    cyclobenzaprine  10 MG tablet Commonly known as: FLEXERIL    labetalol  100 MG tablet Commonly known as: NORMODYNE        TAKE these medications    ferrous sulfate  325 (65 FE) MG EC tablet Take 1 tablet (325 mg total) by mouth every other day.   furosemide 20 MG tablet Commonly known as: LASIX Take 1 tablet (20 mg total) by mouth daily. Start taking on: December 16, 2023   ibuprofen  600 MG tablet Commonly known as: ADVIL  Take 1 tablet (600 mg total) by mouth every 6 (six) hours.   NIFEdipine 30 MG 24 hr tablet Commonly known as: ADALAT CC Take 1 tablet (30 mg total) by mouth daily. Start taking on: December 16, 2023   oxyCODONE -acetaminophen  5-325 MG tablet Commonly known as: PERCOCET/ROXICET Take 1-2 tablets by mouth every 4 (four) hours as needed for moderate pain (pain score 4-6).   potassium chloride SA 20 MEQ tablet Commonly known as: KLOR-CON M Take 1 tablet (20 mEq total) by mouth daily. Start taking on: December 16, 2023   Prenatal Vitamin Plus Low Iron  27-1 MG Tabs 1 tab PO daily.   simethicone  80 MG chewable tablet Commonly known as: MYLICON Chew 1 tablet (80 mg total) by mouth as needed for flatulence.         Discharge home in stable condition Infant Feeding: Breast Infant Disposition:home with mother Discharge instruction: per After Visit Summary and Postpartum booklet. Activity: Advance as tolerated. Pelvic rest for 6 weeks.  Diet: routine diet Future Appointments: Future Appointments  Date Time Provider Department Center  12/19/2023  9:20 AM [facility] NURSE  [facility] None  01/23/2024  9:15 AM Dunn, Rollo DASEN, MD [facility] None    Follow up Visit:  Follow-up Information     [facility] for [facility] at [facility]. Schedule an appointment as soon as possible for a visit in 1 week(s).   Specialty: Obstetrics and Gynecology Why: wound check/ BP check Contact information: [address] [place]  [phone]        [facility] for [facility] at [place]. Schedule an appointment as soon as possible for a visit in 6 week(s).   Specialty: Obstetrics and Gynecology Why:  postpartum visit Contact information: [address] [place]  [address] [phone]               Message 12/15/2023   Please schedule this patient for a In person postpartum visit in 6 weeks with the following provider: Any provider. Additional Postpartum F/U:Incision check 1 week and BP check 1 week  High risk pregnancy complicated by: preE w/SF, twin gestation with malpresentation Delivery mode:     Cieslewicz, GirlA Datra [[id number]]  C-Section, Low Transverse    Zamiah, Tollett [[id number]]  C-Section, Low Transverse Anticipated Birth Control:  Unsure   12/15/2023 Jerilynn DELENA Buddle, MD

## 2023-12-13 ENCOUNTER — Telehealth: Payer: Self-pay

## 2023-12-13 ENCOUNTER — Encounter (HOSPITAL_COMMUNITY): Payer: Self-pay | Admitting: Obstetrics and Gynecology

## 2023-12-13 LAB — CBC
HCT: 28.5 % — ABNORMAL LOW (ref 36.0–46.0)
Hemoglobin: 9.8 g/dL — ABNORMAL LOW (ref 12.0–15.0)
MCH: 30.2 pg (ref 26.0–34.0)
MCHC: 34.4 g/dL (ref 30.0–36.0)
MCV: 87.7 fL (ref 80.0–100.0)
Platelets: 110 K/uL — ABNORMAL LOW (ref 150–400)
RBC: 3.25 MIL/uL — ABNORMAL LOW (ref 3.87–5.11)
RDW: 14.7 % (ref 11.5–15.5)
WBC: 8 K/uL (ref 4.0–10.5)
nRBC: 0.3 % — ABNORMAL HIGH (ref 0.0–0.2)

## 2023-12-13 LAB — CREATININE, SERUM
Creatinine, Ser: 0.99 mg/dL (ref 0.44–1.00)
GFR, Estimated: >60 mL/min (ref >60)

## 2023-12-13 LAB — RPR: RPR Ser Ql: NONREACTIVE

## 2023-12-13 MED ORDER — POTASSIUM CHLORIDE CRYS ER 20 MEQ PO TBCR
10.0000 meq | EXTENDED_RELEASE_TABLET | Freq: Once | ORAL | Status: AC
  Administered 2023-12-13: 10 meq via ORAL
  Filled 2023-12-13: qty 1

## 2023-12-13 MED ORDER — FUROSEMIDE 20 MG PO TABS
20.0000 mg | ORAL_TABLET | Freq: Every day | ORAL | Status: DC
  Administered 2023-12-13 – 2023-12-15 (×3): 20 mg via ORAL
  Filled 2023-12-13 (×3): qty 1

## 2023-12-13 MED ORDER — RHO D IMMUNE GLOBULIN 1500 UNIT/2ML IJ SOSY
300.0000 ug | PREFILLED_SYRINGE | Freq: Once | INTRAMUSCULAR | Status: AC
  Administered 2023-12-13: 300 ug via INTRAVENOUS
  Filled 2023-12-13: qty 2

## 2023-12-13 MED ORDER — FERROUS SULFATE 325 (65 FE) MG PO TABS
325.0000 mg | ORAL_TABLET | ORAL | Status: DC
  Administered 2023-12-13 – 2023-12-15 (×2): 325 mg via ORAL
  Filled 2023-12-13 (×2): qty 1

## 2023-12-13 MED ORDER — POTASSIUM CHLORIDE CRYS ER 20 MEQ PO TBCR: 10.0000 meq | EXTENDED_RELEASE_TABLET | Freq: Every day | ORAL | Status: DC

## 2023-12-13 NOTE — Lactation Note (Signed)
 This note was copied from a baby's chart. Lactation Consultation Note  Patient Name: Courtney Hodge Unijb'd Date: 12/13/2023 Age:34 hours Reason for consult: Initial assessment;Early term 37-38.6wks;Multiple gestation;Other (Comment) (Pre-E)  Used [employer] interpreter services for Swahili interpreter @ [phone] Courtney Hodge  Visited with family of 65 64/53 weeks old twins; Courtney Hodge is a P6 and experienced breastfeeding. She has been supplementing with Similac 20 calorie formula in the meantime because she said she has no milk. Let her know that it's OK to supplement but that we also need some direct stimulation at the breast if babies are no latching consistently or are too sleepy to latch.   Set her up with a DEBP and she initiated pumping during [facility] consult, praised her for her efforts. Unable to take babies to breast at this time as they just had a formula feeding and they were both asleep. Asked her to call for assistance when needed. Reviewed normal ETI behavior, feeding cues, pumping schedule, CDC, supplementation and anticipatory guidelines.  Maternal Data Has patient been taught Hand Expression?: Yes Does the patient have breastfeeding experience prior to this delivery?: Yes How long did the patient breastfeed?: 2 to 2 1/2 years  Feeding Mother's Current Feeding Choice: Breast Milk and Formula Nipple Type: Slow - flow  Lactation Tools Discussed/Used Tools: Pump;Flanges;Coconut oil Flange Size: 24 Breast pump type: Double-Electric Breast Pump Pump Education: Setup, frequency, and cleaning;Milk Storage Reason for Pumping: ETI twins, support milk supply Pumping frequency: initiated pumping at 20 hours post-partum Pumped volume: 0 mL  Interventions Interventions: Breast feeding basics reviewed;Coconut oil;DEBP;Education;LC Services brochure;CDC milk storage guidelines;CDC Guidelines for Breast Pump Cleaning  Plan STS whenever possible Offer the breast +8 times/24 hours or sooner  if feeding cues are present Pump every 3 hours or whenever babies are having a bottle Family will continue supplementing with Similac 20 calorie formula per feeding choice on admission; will follow supplementation guidelines per babies' age in hours  Female visitor present and very supportive, she was helping MOB to hold her flanges while pumping (due to IV placement, still on Mag). All questions and concerns answered, family to contact [facility] services PRN.  Discharge Discharge Education: Engorgement and breast care WIC Program: No  Consult Status Consult Status: Follow-up Date: 12/14/23 Follow-up type: In-patient   Courtney Hodge Courtney Hodge 12/13/2023, 5:20 PM

## 2023-12-13 NOTE — Progress Notes (Signed)
 Subjective: Postpartum Day 1: Cesarean Delivery Patient reports feeling well this morning. She denies headache, visual changes, RUQ/epigastric pain, nausea or emesis.    Objective: Vital signs in last 24 hours: Temp:  [97.7 F (36.5 C)-99.2 F (37.3 C)] 98.3 F (36.8 C) (10/22 0403) Pulse Rate:  [61-89] 62 (10/22 0600) Resp:  [13-23] 18 (10/22 0600) BP: (123-187)/(78-105) 148/89 (10/22 0600) SpO2:  [97 %-100 %] 99 % (10/21 2349) Weight:  [90.7 kg] 90.7 kg (10/21 1326) Blood pressure (!) 148/89, pulse 62, temperature 98.3 F (36.8 C), temperature source Oral, resp. rate 18, height 5' 6 (1.676 m), weight 90.7 kg, last menstrual period 03/16/2023, SpO2 99%, unknown if currently breastfeeding.  Physical Exam:  General: alert, cooperative, and no distress Lochia: appropriate Uterine Fundus: firm Incision: dressing is clean, dry and intact DVT Evaluation: No evidence of DVT seen on physical exam.  Recent Labs    12/12/23 1313 12/13/23 0423  HGB 11.4* 9.8*  HCT 34.2* 28.5*    Assessment/Plan: 33 yo POD#1 s/p P:TCS with di-di twin malpresentation with preeclampsia with severe features Continue magnesium for seizure prophylaxis until 24 hours post delivery Continue procardia and lasix Doing well postoperatively.  Encourage ambulation Discontinue foley Pain management PRN.  Swahili interpreter used during the encounter  Winton Felt, MD 12/13/2023, 6:49 AM

## 2023-12-14 LAB — RH IG WORKUP (INCLUDES ABO/RH)
Fetal Screen: NEGATIVE
Gestational Age(Wks): 38
Unit division: 0

## 2023-12-14 LAB — SURGICAL PATHOLOGY

## 2023-12-14 MED ORDER — POTASSIUM CHLORIDE CRYS ER 20 MEQ PO TBCR
20.0000 meq | EXTENDED_RELEASE_TABLET | Freq: Every day | ORAL | Status: DC
  Administered 2023-12-14 – 2023-12-15 (×2): 20 meq via ORAL
  Filled 2023-12-14 (×2): qty 1

## 2023-12-14 NOTE — Progress Notes (Signed)
 POSTPARTUM PROGRESS NOTE  POD #8  Subjective:  Courtney Hodge is a 33 y.o. H3E3992 s/p primary LTCS at [redacted]w[redacted]d.  She reports she doing well. No acute events overnight. . She denies any problems with ambulating, voiding or po intake. Denies nausea or vomiting. She has  passed minimal flatus. Pain is well controlled.  Lochia is normal.  Objective: Blood pressure 129/79, pulse 72, temperature 98.7 F (37.1 C), temperature source Courtney, resp. rate 18, height 5' 6 (1.676 m), weight 90.7 kg, last menstrual period 03/16/2023, SpO2 99%, unknown if currently breastfeeding.  Physical Exam:  General: alert, cooperative and no distress Chest: no respiratory distress, CTA bilaterally Heart:regular rate and rhythm Abdomen: soft, nontender, nondistended Uterine Fundus: firm, appropriately tender DVT Evaluation: No calf swelling or tenderness Extremities: trace edema Skin: warm, dry; incision clean/dry/intact w/ honeycomb dressing in place  Recent Labs    12/12/23 1313 12/13/23 0423  HGB 11.4* 9.8*  HCT 34.2* 28.5*    Assessment/Plan: Courtney Hodge is a 33 y.o. H3E3992 s/p primary LTCS at [redacted]w[redacted]d for severe preeclampsia and fetal malpresentation.  POD#2 -  Contraception: unknown Feeding: breast Pt is s/p , magnesium sulfate Dispo: Anticipate d/c in AM   LOS: 2 days   Jerilynn Buddle, Md Faculty Attending, Center for [employer] 12/14/2023, 1:51 PM

## 2023-12-15 ENCOUNTER — Other Ambulatory Visit (HOSPITAL_COMMUNITY): Payer: Self-pay

## 2023-12-15 MED ORDER — NIFEDIPINE ER 30 MG PO TB24
30.0000 mg | ORAL_TABLET | Freq: Every day | ORAL | 0 refills | Status: DC
  Filled 2023-12-15: qty 30, 30d supply, fill #0

## 2023-12-15 MED ORDER — SIMETHICONE 80 MG PO CHEW
80.0000 mg | CHEWABLE_TABLET | ORAL | 0 refills | Status: AC | PRN
  Filled 2023-12-15: qty 30, 30d supply, fill #0

## 2023-12-15 MED ORDER — FUROSEMIDE 20 MG PO TABS
20.0000 mg | ORAL_TABLET | Freq: Every day | ORAL | 0 refills | Status: AC
  Filled 2023-12-15: qty 3, 3d supply, fill #0

## 2023-12-15 MED ORDER — OXYCODONE-ACETAMINOPHEN 5-325 MG PO TABS
1.0000 | ORAL_TABLET | ORAL | 0 refills | Status: AC | PRN
  Filled 2023-12-15: qty 30, 3d supply, fill #0

## 2023-12-15 MED ORDER — POTASSIUM CHLORIDE CRYS ER 20 MEQ PO TBCR
20.0000 meq | EXTENDED_RELEASE_TABLET | Freq: Every day | ORAL | 0 refills | Status: AC
  Filled 2023-12-15: qty 3, 3d supply, fill #0

## 2023-12-15 MED ORDER — IBUPROFEN 600 MG PO TABS
600.0000 mg | ORAL_TABLET | Freq: Four times a day (QID) | ORAL | 0 refills | Status: AC
  Filled 2023-12-15: qty 30, 8d supply, fill #0

## 2023-12-15 NOTE — Social Work (Signed)
 CSW acknowledged consult for Mother of infant twins needs car seats prior to discharge.  Video Remote Interpreter P[place]ice [phone]  CSW followed up with MOB at bedside and introduced CSW role. CSW informed MOB regarding the reason for the visit and assessed for needs. MOB reported that she and FOB did not have enough money to purchase two car seats for the infants. CSW informed MOB about the car seat program and informed her  that the cost for each car seat was $30 dollars. MOB was receptive to the car seat program and agreed to pay $30 for each car seat.  CSW inquired if MOB had a crib for each infant. MOB reported that FOB would purchase cribs for the infants. CSW asked if MOB could call FOB and confirm that he had purchased the cribs. MOB called FOB and FOB stated that he was not sure if he would be able to purchase the cribs before the infants come home. CSW informed parents about the Cribs for Kids program and offered to provide a pack n play for each child. Both parents were receptive.   MOB ended the call with FOB.   CSW asked MOB if she received WIC/SNAP benefits. MOB reported that she received SNAP benefits but had not applied for [facility]. CSW offered to make a [facility] referrals. MOB was receptive.    CSW inquired if MOB would have diapers, wipes and clothes for her infants. MOB reported that she had some essential item to care for her infants but could use support with more diapers and wipes. CSW offered to make a referral to [facility]. CSW agreed to make a referral.   -CSW delivered two pack n play and two car seats to bedside. MOB read over each waiver and MOB completed each waiver form.    -CSW made a WIC referral  -[facility] informed CSW that MOB is scheduled to be seen on November 10th at 10:00 AM.    MOB expressed appreciation for the support.    Nat Quiet, MSW, LCSW Clinical Social Worker  [phone] 12/15/2023  2:07 PM

## 2023-12-15 NOTE — Plan of Care (Signed)
  Problem: Education: Goal: Knowledge of disease or condition will improve Outcome: Adequate for Discharge Goal: Knowledge of the prescribed therapeutic regimen will improve Outcome: Adequate for Discharge   Problem: Fluid Volume: Goal: Peripheral tissue perfusion will improve Outcome: Adequate for Discharge   Problem: Clinical Measurements: Goal: Complications related to disease process, condition or treatment will be avoided or minimized Outcome: Adequate for Discharge   Problem: Education: Goal: Knowledge of General Education information will improve Description: Including pain rating scale, medication(s)/side effects and non-pharmacologic comfort measures Outcome: Adequate for Discharge   Problem: Health Behavior/Discharge Planning: Goal: Ability to manage health-related needs will improve Outcome: Adequate for Discharge   Problem: Clinical Measurements: Goal: Ability to maintain clinical measurements within normal limits will improve Outcome: Adequate for Discharge Goal: Will remain free from infection Outcome: Adequate for Discharge Goal: Diagnostic test results will improve Outcome: Adequate for Discharge Goal: Respiratory complications will improve Outcome: Adequate for Discharge Goal: Cardiovascular complication will be avoided Outcome: Adequate for Discharge   Problem: Activity: Goal: Risk for activity intolerance will decrease Outcome: Adequate for Discharge   Problem: Nutrition: Goal: Adequate nutrition will be maintained Outcome: Adequate for Discharge   Problem: Coping: Goal: Level of anxiety will decrease Outcome: Adequate for Discharge   Problem: Elimination: Goal: Will not experience complications related to bowel motility Outcome: Adequate for Discharge Goal: Will not experience complications related to urinary retention Outcome: Adequate for Discharge   Problem: Pain Managment: Goal: General experience of comfort will improve and/or be  controlled Outcome: Adequate for Discharge   Problem: Safety: Goal: Ability to remain free from injury will improve Outcome: Adequate for Discharge   Problem: Skin Integrity: Goal: Risk for impaired skin integrity will decrease Outcome: Adequate for Discharge   Problem: Education: Goal: Knowledge of the prescribed therapeutic regimen will improve Outcome: Adequate for Discharge Goal: Understanding of sexual limitations or changes related to disease process or condition will improve Outcome: Adequate for Discharge Goal: Individualized Educational Video(s) Outcome: Adequate for Discharge   Problem: Self-Concept: Goal: Communication of feelings regarding changes in body function or appearance will improve Outcome: Adequate for Discharge   Problem: Skin Integrity: Goal: Demonstration of wound healing without infection will improve Outcome: Adequate for Discharge   Problem: Education: Goal: Knowledge of condition will improve Outcome: Adequate for Discharge Goal: Individualized Educational Video(s) Outcome: Adequate for Discharge Goal: Individualized Newborn Educational Video(s) Outcome: Adequate for Discharge   Problem: Activity: Goal: Will verbalize the importance of balancing activity with adequate rest periods Outcome: Adequate for Discharge Goal: Ability to tolerate increased activity will improve Outcome: Adequate for Discharge   Problem: Coping: Goal: Ability to identify and utilize available resources and services will improve Outcome: Adequate for Discharge   Problem: Life Cycle: Goal: Chance of risk for complications during the postpartum period will decrease Outcome: Adequate for Discharge   Problem: Role Relationship: Goal: Ability to demonstrate positive interaction with newborn will improve Outcome: Adequate for Discharge   Problem: Skin Integrity: Goal: Demonstration of wound healing without infection will improve Outcome: Adequate for Discharge

## 2023-12-16 LAB — TYPE AND SCREEN
ABO/RH(D): O NEG
Antibody Screen: POSITIVE
Unit division: 0
Unit division: 0

## 2023-12-16 LAB — BPAM RBC
Blood Product Expiration Date: 202511242359
Blood Product Expiration Date: 202511242359
Unit Type and Rh: 9500
Unit Type and Rh: 9500

## 2023-12-19 ENCOUNTER — Ambulatory Visit

## 2023-12-19 DIAGNOSIS — Z4889 Encounter for other specified surgical aftercare: Secondary | ICD-10-CM | POA: Diagnosis not present

## 2023-12-19 MED ORDER — DOCUSATE SODIUM 100 MG PO CAPS
100.0000 mg | ORAL_CAPSULE | Freq: Two times a day (BID) | ORAL | 2 refills | Status: AC | PRN
Start: 2023-12-19 — End: ?

## 2023-12-19 NOTE — Progress Notes (Signed)
 Subjective:     Courtney Hodge is a 33 y.o. female who presents to the clinic 1 weeks status post low uterine, transverse cesarean section. Pt reports incision is healing well.      Objective:    BP 135/84   Pulse 79   LMP 03/16/2023 (Approximate)  General:  alert, well appearing, in no apparent distress  Incision:   healing well, no drainage, no erythema, no hernia, no seroma, no swelling, no dehiscence, incision well approximated     Assessment:    Doing well postoperatively. Pt does complain of constipation, requesting an rx be sent to pharmacy. Colace sent per protocol.    Plan:    1. Continue any current medications. 2. Wound care discussed. 3. Follow up: PP visit or PRN.   Subjective:  Courtney Hodge is a [id number] here for BP check.  She is 1 week postpartum following a primary cesarean section.    Hypertension ROS: Patient denies headache and visual changes   Objective:  BP 135/84   Pulse 79   LMP 03/16/2023 (Approximate)   Appearance alert, well appearing, and in no distress.  Assessment:   Blood Pressure well controlled.   Plan:  Current treatment plan is effective, no change in therapy..   Blakely Maranan N Kelie Gainey, RN

## 2024-01-01 DIAGNOSIS — Z0289 Encounter for other administrative examinations: Secondary | ICD-10-CM

## 2024-01-23 ENCOUNTER — Ambulatory Visit (INDEPENDENT_AMBULATORY_CARE_PROVIDER_SITE_OTHER): Payer: Self-pay | Admitting: Obstetrics and Gynecology

## 2024-01-23 ENCOUNTER — Encounter: Payer: Self-pay | Admitting: Obstetrics and Gynecology

## 2024-01-23 DIAGNOSIS — I10 Essential (primary) hypertension: Secondary | ICD-10-CM

## 2024-01-23 MED ORDER — NIFEDIPINE ER 30 MG PO TB24: 30.0000 mg | ORAL_TABLET | Freq: Every day | ORAL | 2 refills | Status: AC

## 2024-01-23 NOTE — Progress Notes (Signed)
 Post Partum Visit Note  Courtney Hodge is a 33 y.o. [id number] female who presents for a postpartum visit. She is 6 weeks postpartum following a primary cesarean section.  I have fully reviewed the prenatal and intrapartum course. The delivery was at [redacted]w[redacted]d gestational weeks.  Anesthesia: spinal. Postpartum course has been good. Baby is doing well. Baby is feeding by both breast and bottle - Similac Advance. Bleeding no bleeding. Bowel function is normal. Bladder function is normal. Patient is not sexually active. Contraception method is condoms. Postpartum depression screening: negative.  Swahili interpreter present  Patient has not taken BP meds for at least 3 weeks  The pregnancy intention screening data noted above was reviewed. Potential methods of contraception were discussed. The patient elected to proceed with No data recorded.   Edinburgh Postnatal Depression Scale - 01/23/24 0927       Edinburgh Postnatal Depression Scale:  In the Past 7 Days   I have been able to laugh and see the funny side of things. 0    I have looked forward with enjoyment to things. 0    I have blamed myself unnecessarily when things went wrong. 0    I have been anxious or worried for no good reason. 0    I have felt scared or panicky for no good reason. 0    Things have been getting on top of me. 0    I have been so unhappy that I have had difficulty sleeping. 0    I have felt sad or miserable. 0    I have been so unhappy that I have been crying. 1    The thought of harming myself has occurred to me. 0    Edinburgh Postnatal Depression Scale Total 1          Health Maintenance Due  Topic Date Due   Hepatitis B Vaccines 19-59 Average Risk (1 of 3 - 19+ 3-dose series) Never done   HPV VACCINES (1 - 3-dose SCDM series) Never done   Influenza Vaccine  09/22/2023   COVID-19 Vaccine (1 - 2025-26 season) Never done    The following portions of the patient's history were reviewed and updated as  appropriate: allergies, current medications, past family history, past medical history, past social history, past surgical history, and problem list.  Review of Systems Pertinent items are noted in HPI.  Objective:  BP (!) 157/102   Pulse 70   Ht 5' 5 (1.651 m)   Wt 195 lb 3.2 oz (88.5 kg)   LMP 03/16/2023 (Approximate)   Breastfeeding Yes   BMI 32.48 kg/m    General:  alert, cooperative, and no distress   Breasts:  not indicated  Lungs: Nonlabored breathing  Heart:  Regular rate  Abdomen: Soft, nontender, nondistended   Wound Pfannenstiel clean/dry/intact  GU exam:  not indicated       Assessment:   1. Postpartum exam (Primary) See below. Never completed glucose screening during pregnancy. Reviewed GTT vs A1c. Patient will do A1c - HgB A1c  2. Chronic hypertension Recommend restart meds, return for BP check in 1 week. F/u with PCP - NIFEdipine  (ADALAT  CC) 30 MG 24 hr tablet; Take 1 tablet (30 mg total) by mouth daily.  Dispense: 30 tablet; Refill: 2   Plan:   Essential components of care per ACOG recommendations:  1.  Mood and well being: Patient with negative depression screening today. Reviewed local resources for support.  - Patient tobacco use? No.   -  hx of drug use? No.    2. Infant care and feeding:  -Patient currently breastmilk feeding?   -Social determinants of health (SDOH) reviewed in EPIC. No concerns   3. Sexuality, contraception and birth spacing - Reviewed reproductive life planning. Reviewed contraceptive methods based on pt preferences and effectiveness.  Patient desired Female Condom today.   - Discussed birth spacing of 18 months  4. Sleep and fatigue -Encouraged family/partner/community support of 4 hrs of uninterrupted sleep to help with mood and fatigue  5. Physical Recovery  - Discussed patients delivery and complications.  - Patient had a C-section, no problems at delivery.   - Patient has urinary incontinence? No. - Patient is safe to  resume physical and sexual activity  6.  Health Maintenance - HM due items addressed No - none indicated - Last pap smear  Diagnosis  Date Value Ref Range Status  05/26/2021   Final   - Negative for intraepithelial lesion or malignancy (NILM)   Pap smear not done at today's visit.  -Breast Cancer screening indicated? No.   7. Chronic Disease/Pregnancy Condition follow up: Hypertension  - PCP follow up  Rollo ONEIDA Bring, MD [facility], [facility]

## 2024-01-24 ENCOUNTER — Ambulatory Visit: Payer: Self-pay | Admitting: Obstetrics and Gynecology

## 2024-01-24 LAB — HEMOGLOBIN A1C
Est. average glucose Bld gHb Est-mCnc: 97 mg/dL
Hgb A1c MFr Bld: 5 % (ref 4.8–5.6)

## 2024-01-30 ENCOUNTER — Ambulatory Visit
